# Patient Record
Sex: Male | Born: 1945
Health system: Southern US, Community
[De-identification: ages and names within clinical notes are randomized; demographics above are authoritative.]

## PROBLEM LIST (undated history)

## (undated) DIAGNOSIS — E782 Mixed hyperlipidemia: Secondary | ICD-10-CM

## (undated) DIAGNOSIS — Z87442 Personal history of urinary calculi: Secondary | ICD-10-CM

## (undated) DIAGNOSIS — I2510 Atherosclerotic heart disease of native coronary artery without angina pectoris: Secondary | ICD-10-CM

## (undated) DIAGNOSIS — N471 Phimosis: Secondary | ICD-10-CM

## (undated) DIAGNOSIS — K5790 Diverticulosis of intestine, part unspecified, without perforation or abscess without bleeding: Secondary | ICD-10-CM

## (undated) DIAGNOSIS — I700 Atherosclerosis of aorta: Secondary | ICD-10-CM

## (undated) DIAGNOSIS — I701 Atherosclerosis of renal artery: Secondary | ICD-10-CM

## (undated) DIAGNOSIS — I10 Essential (primary) hypertension: Secondary | ICD-10-CM

## (undated) DIAGNOSIS — N289 Disorder of kidney and ureter, unspecified: Secondary | ICD-10-CM

## (undated) DIAGNOSIS — Z8719 Personal history of other diseases of the digestive system: Secondary | ICD-10-CM

## (undated) DIAGNOSIS — N433 Hydrocele, unspecified: Secondary | ICD-10-CM

## (undated) DIAGNOSIS — C679 Malignant neoplasm of bladder, unspecified: Secondary | ICD-10-CM

## (undated) HISTORY — PX: TRANSURETHRAL RESECTION OF BLADDER TUMOR: SHX2575

## (undated) HISTORY — PX: OTHER SURGICAL HISTORY: SHX169

## (undated) HISTORY — PX: TONSILLECTOMY: SUR1361

## (undated) HISTORY — PX: APPENDECTOMY: SHX54

---

## 1999-10-22 ENCOUNTER — Ambulatory Visit (HOSPITAL_COMMUNITY): Admission: RE | Admit: 1999-10-22 | Discharge: 1999-10-22 | Payer: Self-pay | Admitting: Family Medicine

## 1999-10-22 ENCOUNTER — Encounter: Payer: Self-pay | Admitting: Family Medicine

## 2001-07-16 ENCOUNTER — Encounter: Payer: Self-pay | Admitting: Emergency Medicine

## 2001-07-17 ENCOUNTER — Inpatient Hospital Stay (HOSPITAL_COMMUNITY): Admission: EM | Admit: 2001-07-17 | Discharge: 2001-07-21 | Payer: Self-pay | Admitting: Emergency Medicine

## 2002-05-18 ENCOUNTER — Ambulatory Visit (HOSPITAL_COMMUNITY): Admission: RE | Admit: 2002-05-18 | Discharge: 2002-05-19 | Payer: Self-pay | Admitting: Cardiology

## 2003-11-15 ENCOUNTER — Observation Stay (HOSPITAL_COMMUNITY): Admission: EM | Admit: 2003-11-15 | Discharge: 2003-11-16 | Payer: Self-pay | Admitting: Emergency Medicine

## 2008-04-26 ENCOUNTER — Inpatient Hospital Stay (HOSPITAL_COMMUNITY): Admission: RE | Admit: 2008-04-26 | Discharge: 2008-04-27 | Payer: Self-pay | Admitting: Cardiology

## 2008-05-12 ENCOUNTER — Encounter (HOSPITAL_COMMUNITY): Admission: RE | Admit: 2008-05-12 | Discharge: 2008-08-10 | Payer: Self-pay | Admitting: Cardiology

## 2009-02-11 DIAGNOSIS — C679 Malignant neoplasm of bladder, unspecified: Secondary | ICD-10-CM

## 2009-02-11 HISTORY — DX: Malignant neoplasm of bladder, unspecified: C67.9

## 2009-06-05 ENCOUNTER — Ambulatory Visit (HOSPITAL_BASED_OUTPATIENT_CLINIC_OR_DEPARTMENT_OTHER): Admission: RE | Admit: 2009-06-05 | Discharge: 2009-06-06 | Payer: Self-pay | Admitting: Urology

## 2010-05-01 LAB — POCT I-STAT 4, (NA,K, GLUC, HGB,HCT)
Glucose, Bld: 119 mg/dL — ABNORMAL HIGH (ref 70–99)
HCT: 40 % (ref 39.0–52.0)
Hemoglobin: 13.6 g/dL (ref 13.0–17.0)
Potassium: 4.5 mEq/L (ref 3.5–5.1)
Sodium: 140 mEq/L (ref 135–145)

## 2010-05-24 LAB — CBC
HCT: 38.4 % — ABNORMAL LOW (ref 39.0–52.0)
Hemoglobin: 13.7 g/dL (ref 13.0–17.0)
MCHC: 35.6 g/dL (ref 30.0–36.0)
MCV: 89.7 fL (ref 78.0–100.0)
Platelets: 128 10*3/uL — ABNORMAL LOW (ref 150–400)
RBC: 4.28 MIL/uL (ref 4.22–5.81)
RDW: 12.6 % (ref 11.5–15.5)
WBC: 5.8 10*3/uL (ref 4.0–10.5)

## 2010-05-24 LAB — BASIC METABOLIC PANEL
BUN: 13 mg/dL (ref 6–23)
Calcium: 8.6 mg/dL (ref 8.4–10.5)
Creatinine, Ser: 1.02 mg/dL (ref 0.4–1.5)
GFR calc Af Amer: >60 mL/min (ref >60)
GFR calc non Af Amer: >60 mL/min (ref >60)

## 2010-06-26 NOTE — Discharge Summary (Signed)
Chad [name], Chad [name] NO.:  [id number]   MEDICAL RECORD NO.:  [id number]          PATIENT TYPE:  INP   LOCATION:  2508                         FACILITY:  [facility]   PHYSICIAN:  Francisca December, M.D.  DATE OF BIRTH:  10-Jun-1945   DATE OF ADMISSION:  04/26/2008  DATE OF DISCHARGE:  04/27/2008                               DISCHARGE SUMMARY   DISCHARGE DIAGNOSES:  1. Coronary artery disease status post Endeavor stent to the right      coronary artery.  2. Known coronary artery disease.  3. Hyperlipidemia.  4. Gastroesophageal reflux disease.  5. Strong family history of coronary artery disease.  6. Status post appendectomy.  7. Long-term medication use.   HOSPITAL COURSE:  [name] is a 65 year old male who had a Cardiolite,  that I assume from our records was a routine Cardiolite, that revealed a  change from one done prior to this one.  Because of the results, Dr.  Amil Amen wanted to follow up with cardiac catheterization and the patient  agreed.   The patient was brought into the hospital on April 26, 2008, and  underwent Endeavor stent x2 to the right coronary artery.  He tolerated  this procedure well.  He was kept in the hospital overnight and was  ready for discharge home the following day.   LABORATORY STUDIES:  Hemoglobin 13.7, hematocrit 38.4, platelets 120,  white count 5.8.  Sodium 141, potassium 4.1, BUN 13, creatinine 1.02.   The patient is being discharged to home in stable but improved  condition.   DISCHARGE MEDICATIONS:  1. Plavix 75 mg a day.  2. Protonix 40 mg a day.  3. Vytorin 10/80 mg daily.  4. Folic acid 1600 mcg daily.  5. Niaspan 1000 mg a day.  6. Enteric-coated aspirin 325 mg a day.  7. Metoprolol 50 mg 1 tablet twice a day (the admission note states      that he was on 25 mg one-half tablet twice a day, but the patient      reassures me that he was on 50 mg tablet and was taking half of      this twice a day).   The patient  is to increase activity slowly.  No lifting over 10 pounds  for 1 week.  No driving for 2 days.  Remain on a low-sodium heart-  healthy diet.  Clean cath site gently with soap and water.  Follow up  with Dr. Deitra [name], nurse practitioner, on May 10, 2008, at  1:00 p.m.      Chad [name], P.A.      Francisca December, M.D.  Electronically Signed    LB/MEDQ  D:  04/27/2008  T:  04/27/2008  Job:  [id number]   cc:   Chad [name], M.D.

## 2010-06-29 NOTE — H&P (Signed)
NAME:  Chad [name], Chad [name] NO.:  [id number]   MEDICAL RECORD NO.:  [id number]          PATIENT TYPE:  INP   LOCATION:  2901                         FACILITY:  [facility]   PHYSICIAN:  Francisca December, M.D.  DATE OF BIRTH:  Jun 18, 1945   DATE OF ADMISSION:  11/15/2003  DATE OF DISCHARGE:                                HISTORY & PHYSICAL   PRIMARY CARE PHYSICIAN:  Francesca Oman, M.D.   CARDIOLOGIST:  Corliss Marcus, M.D.   CHIEF COMPLAINT:  Chest pain.   HISTORY OF PRESENT ILLNESS:  This is a 65 year old male with known history  of CAD status post stent to the proximal circumflex and a PLA branch in June  2003, with subsequent Taxus stents to the LAD and an ostial diagonal off the  LAD in April 2004.  The patient had normal Cardiolite study at our office in  January 2004 and had not been experiencing any problems such as chest pain,  etc., until the evening prior to admission about 8:30 p.m. to 9 p.m.  He  began with an onset of vague left anterior chest pain which is unlike his  prior cardiac pain that radiated to his left shoulder and down his left arm.  He described the chest discomfort as being level 2/10.  He was most bothered  by the left arm pain and numbness and tingling sensation and was at least a  4/10.  The discomfort would wax and wane during the night and essentially  did not resolve until he came to the ER this morning about 11 a.m. and  received sublingual nitroglycerin.  The patient did not take any  nitroglycerin at home.  Currently, he is free of any discomfort in the ER.  He denied any associated symptoms with this pain and has not experienced any  angina or dyspnea on exertion or orthopnea since last being seen at our  office until the past 24 hours.   REVIEW OF SYSTEMS:  No fevers, chills, myalgias, etc.  Denies dizziness,  unilateral visual disturbance, any difficulty with gripping on the right or  left hand.  No facial drooping or speech changes.   Cardiac and respiratory  symptoms as above.  No cough or hemoptysis.  No abdominal pain.  No urinary  symptoms.  No lower extremity swelling or pain.   FAMILY MEDICAL HISTORY:  His father died at age 9, history of CABG.  Mother  died at age 56 of an MI.   SOCIAL HISTORY:  He is married, does not utilize tobacco or alcohol.  He  works as an Art gallery manager.   PAST MEDICAL HISTORY:  1.  CAD.      1.  PTCA with stent to the proximal circumflex and PLA, June 2003.      2.  Taxus stent to the LAD and ostial diagonal with stent crush to the          diagonal stent after the LAD stent was place.  2.  Dyslipidemia.  3.  NYHA class I angina in the past.  4.  Repeat Cardiolite at our office January 2004  normal.  Recommendation at      that time was to continue Plavix for an additional 9 months.  5.  GERD.   ALLERGIES:  PENICILLIN, which causes a rash.   MEDICATIONS:  1.  Plavix 75 mg daily.  2.  Folic acid 1.5 mg daily.  3.  Zocor 40 mg daily.  4.  Aspirin 325 mg daily.   PHYSICAL EXAMINATION:  GENERAL:  Pleasant male, no acute distress.  Currently without any chest or arm pain.  VITAL SIGNS:  Initial blood pressure was 159/101; down to 128/79 with a  pulse of 57, respirations 20 after sublingual nitroglycerin.  NEUROLOGIC:  Alert and oriented x3.  Moving all extremities x4.  No focal  neurologic deficits.  HEENT:  Head is normocephalic.  Sclerae noninjected.  NECK:  Supple, no adenopathy.  CHEST:  Bilateral lung sounds are clear to auscultation posteriorly.  Respiratory effort is nonlabored.  He is on room air, saturating 99%.  HEART:  S1, S2.  No rubs, murmurs, thrills; no gallops.  No JVD.  Carotids  are 2+ bilaterally without bruits.  He is in sinus rhythm on bedside  telemetry.  ABDOMEN:  Soft.  Bowel sounds are present all four quadrants.  Abdomen is  nontender with minimal palpation.  There is no hepatosplenomegaly, masses,  or bruits noted.  No hernias as well.  EXTREMITIES:   Symmetric in appearance without effusion or erythema to the  large and small joints.  There is no dependent edema and pulses are 2+  bilaterally.   LABORATORY DATA:  Myoglobin 108 and 87.7, MB 1.1 and 1.5, troponin I is less  than 0.05 and less than 0.05.  Hemoglobin 15.4; hematocrit 43.6; white count  5700; platelet count 179,000.  Sodium 140, potassium 4.1, BUN 12, creatinine  1.3.  LFTs are normal.  Magnesium 2.3.  INR 0.9, PTT 27.  Diagnostics:  EKG  was done in the ER, shows sinus rhythm without any ST or T wave changes that  would be consistent with ischemia.  No old EKGs available for comparison.  Portable chest x-ray:  Read by the radiology as bibasilar atelectasis;  otherwise, no acute problems.   IMPRESSION:  1.  Chest pain, known coronary artery disease, two-vessel.  2.  Dyslipidemia.  3.  Gastroesophageal reflux disease.   PLAN:  Dr. Amil Amen has seen and evaluated the patient and will proceed as  follows:  1.  Continue aspirin and Plavix.  Add weight-based heparin.  2.  The patient was not on a beta blocker prior to admission . We will add      this as blood pressure and heart rate tolerate.  3.  Although the patient's symptoms are not typical of his prior cardiac      presentation, and EKG and enzymes are negative so far and the patient      has been experiencing pain for greater than 8 hours, Dr. Amil Amen is very      suspicious this is coronary ischemic pain since it was relieved by      nitroglycerin and initiation of heparin anticoagulation.  He has      recommended diagnostic coronary angiography, possible PTCA.  Goals,      risks, alternatives discussed.  The patient agrees and wishes to proceed      and the patient will go to the catheterization laboratory this afternoon      per Dr. Amil Amen.  4.  Again, we will repeat serial enzymes looking for any bump  in troponin I     or CK or MB that may indicate the patient has had an acute MI.  Will      repeat EKG in the  morning post catheterization.  5.  In regards to dyslipidemia, will continue his Zocor 40 and repeat a      fasting lipid panel since this apparently has not been done in several      months.  6.  In regards to the patient's history of GERD, if he develops symptoms      will consider adding a PPI.       ALE/MEDQ  D:  11/16/2003  T:  11/16/2003  Job:  [id number]   cc:   Meredith Staggers, M.D.  [address]  [place]  [place] [address]  Fax: [phone]

## 2010-06-29 NOTE — Procedures (Signed)
[facility]  Patient:    Chad [name], Chad [name] Visit Number: [id number] MRN: [id number]          Service Type: MED Location: [id number] Attending Physician:  Armanda Magic Dictated by:   Francisca December, M.D. Proc. Date: 07/20/01 Admit Date:  07/17/2001 Discharge Date: 07/21/2001   CC:         Meredith Staggers, M.D.   Procedure Report  PROCEDURES PERFORMED: 1. Percutaneous transluminal coronary angioplasty/stent implantation, proximal    left circumflex. 2. Percutaneous transluminal coronary angioplasty, distal posterolateral    branch via the right coronary artery.  INDICATIONS:  The patient is a 65 year old man admitted with unstable angina on July 17, 2001.  He underwent coronary angiography by Dr. Armanda Magic on July 17, 2001, revealing three-vessel disease with only moderate stenosis in the LAD.  There was subtotal stenosis in the distal right and left circumflex. Cardiac surgery was consulted and after consultation with them and the patient, the decision was made to proceed with percutaneous revascularization.  PROCEDURAL NOTE:  PCI was performed following the percutaneous insertion of a #6 French catheter sheath utilizing an anterior approach over a guiding J wire into the right femoral artery.  The patient received 73.5 mg of Angiomax in a constant infusion at 1.75 mg/kg per hour throughout the procedure.  A #6 [place] Voda 3.5 left guiding catheter was advanced to the ascending aorta where the left coronary os was engaged.  A 0.014-inch Scimed luge intracoronary guidewire was passed across the lesion in the left circumflex without difficulty.  Initial balloon dilatation was performed with a 3.0 x 15-mm Scimed Maverick intracoronary balloon.  This was inflated to 6 atmospheres for approximately 1 minute.  This balloon was removed and a 3.5 x 16-mm Scimed Express 2 intracoronary stent was positioned across the lesion carefully.  It was  deployed at a peak pressure of 16 atmospheres for approximately 45 seconds.  Following the intracoronary administration of 0.2 mg of nitroglycerin, cineangiography was performed in LAO and RAO projections, both with and without the guidewire in place.  This confirmed wide patency and the guiding catheter and guidewire were removed.  They were exchanged for a #6 [place] Keiz right horizontal takeoff #3 guiding catheter.  The right coronary os was engaged and a 0.014-inch Scimed luge intracoronary guidewire was passed across the lesion in the distal right coronary with minimal difficulty. Balloon dilatation was then performed with a 2.5 x 15-mm Scimed Maverick intracoronary balloon.  The balloon was inflated to a peak pressure of 8 atmospheres for approximately 2 minutes.  The balloon was removed and again, angiography performed in orthogonal views, confirming adequate patency.  The guidewire was removed as well as the guiding catheter.  The catheter sheath was sutured into place and the patient was transferred to the recovery area in stable condition with an intact distal pulse.  ACT on Angiomax was well over 300 seconds.  ANGIOGRAPHY:  As mentioned, the lesion treated was in the proximal left circumflex giving rise to a large circumflex marginal which was the dominant vessel on the lateral wall of the heart.  Following balloon dilatation and stent implantation, there was no residual stenosis.  The other treated lesion was in the distal posterolateral segment/branch off the right coronary.  Following balloon dilatation alone, there was a 20% residual stenosis.  FINAL IMPRESSION: 1. Atherosclerotic coronary vascular disease, three-vessel. 2. Status post successful percutaneous transluminal coronary angioplasty,    distal right coronary/posterolateral branch.  3. Status post successful percutaneous transluminal coronary angioplasty    stent, proximal left circumflex. 4. Typical angina was  not reproduced with device insertion or balloon    inflation. Dictated by:   Francisca December, M.D. Attending Physician:  Armanda Magic DD:  07/20/01 TD:  07/21/01 Job: [id number]

## 2010-06-29 NOTE — Cardiovascular Report (Signed)
NAME:  Chad, [name] NO.:  [id number]   MEDICAL RECORD NO.:  [id number]          PATIENT TYPE:  INP   LOCATION:  1827                         FACILITY:  [facility]   PHYSICIAN:  Francisca December, M.D.  DATE OF BIRTH:  January 01, 1946   DATE OF PROCEDURE:  DATE OF DISCHARGE:                              CARDIAC CATHETERIZATION   PROCEDURE PERFORMED:  1.  Left heart catheterization.  2.  Coronary angiography.  3.  Left ventriculogram.  4.  Percutaneous intervention/balloon dilatation on the ostial diagonal      branch.  5.  Intravascular ultrasound.  6.  Percutaneous closure of right femoral artery.   INDICATIONS:  [name] is a 65 year old man who is approximately 18  months status post drug-eluting stent implantation, crush technique, of  the small LAD and ostial diagonal.  He also has a stent in the left  circumflex dating to 2003.  A balloon dilatation was performed in the  posterolateral segment to the left ventricular branch at that time as well.  He presented to the emergency room today with prolonged anterior chest pain  and left arm discomfort.  No significant ECG changes were noted, but the  discomfort was promptly relieved with intravenous nitroglycerin.  He was  therefore brought to the catheterization laboratory at this time to identify  the extent of disease and provide for further therapeutic options.   PROCEDURAL NOTE:  Patient is brought to the cardiac catheterization  laboratory from the emergency room, where the right groin was prepped and  draped in the usual sterile fashion.  Local anesthesia was obtained with  infiltration of 1% lidocaine.  A 6 French catheter sheath was inserted  percutaneously into the right femoral artery utilizing an anterior approach  over a guiding J wire.  A 110 cm pigtail catheter was used and we measured  pressures in the ascending aorta and in the left ventricle both prior to and  following the ventriculogram.  A  30 degree RAO __________  left  ventriculogram was performed utilizing a power injector.  Then 45 cc of  contrast material was injected at 13 cc/second.  Scintiangiography of both  left and right coronary arteries was then performed using a 6 [place] #4 left  and right Judkins catheters.  Scintiangiography of each coronary artery was  conducted in multiple LAO and RAO projections.  The angiograms were then  studied carefully and compared to the previous 2004 angiogram.  The culprit  lesion was felt to be an ostial stenosis from the diagonal branch at the  site of the previous stent, crush technique.  There is a long 50-60%  stenosis in the proximal and mid right coronary, but it has not changed  significantly since 2004; therefore, I proceed with percutaneous  intervention on the ostial diagonal.   The 6 French catheter sheath was exchanged for a 7 French catheter sheath  overlying a guiding J wire.  The patient received 4500 units of heparin  intravenously, resulting in an ACT of 308 seconds.  He also received a  double bolus and constant infusion of Integrilin.  Initial attempts to cross  the lesion with a 0.014 ACS Guidant Cross-It XT 100 wire were unsuccessful.  This was removed and exchanged for a Whisper wire.  This was also  unsuccessful.  This was removed and exchanged for a Choice PT moderate  support wire.  After extensive manipulation, this did successfully cross the  lesion into the diagonal branch.  Initial balloon dilatation was performed  with a 1.5/20 mm SciMed Maverick intracoronary balloon.  It is inflated to  10 atmospheres for approximately 80 seconds.  This balloon was deflated and  removed, and a 2.0/15 SciMed Maverick intracoronary balloon advanced into  place.  This was inflated to 6 atmospheres for 90 seconds.  This balloon was  removed, and a SciMed luge intracoronary guidewire advanced down the LAD.  Intravascular ultrasound was performed on one pass with an  Atlantis  catheter.  The images were reviewed, and while there was not protruding  stent struts, there was a lot of irregularity in the lumen around the area  of the diagonal origin.  Therefore, the IVUS catheter was removed, and a  3.0/20 mm SciMed Maverick advanced into the LAD and a 2.5/15 mm SciMed  Maverick advanced into the diagonal ostium.  Both balloons were then  inflated to a peak pressure on the LAD of 4 atmospheres and on the diagonal  of 8 atmospheres.  This is for approximately one minute.  The balloons were  removed.  Intravascular ultrasound performed again.  I was not satisfied  with the appearance of the lumen of the LAD; therefore, the 3.0/20 mm  Maverick was again advanced into the LAD, and a 2.5/15 mm Maverick into the  diagonal.  Again, the balloons were inflated.  This time, the LAD balloon  was inflated to 5-6 atmospheres and the Maverick again to 8 atmospheres.  The balloons were removed.  Scintiangiography was performed __________  views and found to demonstrate patency.  The guidewire was removed, and  again, scintiangiography performed.  There was a horizontal separation  visualized in the LAD beneath the origin of the diagonal branch.  It may  represent an elevated stent __________  or a sub stent intimal dissection.  I did not feel that additional balloon dilatation was likely to be helpful  for this and may further compromise the origin of the diagonal, which was  widely patent; therefore, I discontinued further interventions.  A right  femoral arteriogram was performed in a 45 degree angulation.  It confirmed  the arteriotomy site to be well above the bifurcation.  The Angioseal  percutaneous closure device was then deployed successfully.  The patient was  transported to the recovery area in stable condition with an intact distal  pulse.   HEMODYNAMICS:  Systemic arterial pressure was 147/79 with a mean of 106 mmHg.  There was no systolic gradient across  the aortic valve.  The left  ventricular end-diastolic pressure was 9 mmHg.   ANGIOGRAPHY:  The left ventriculogram demonstrated intact left ventricular  size and global systolic function without regional wall motion abnormality.  A visual estimate of the ejection fraction is 60%.  There are stents easily  visible in the left coronary artery, and there is extensive right coronary  calcification.  There is no significant mitral regurgitation.   There was a right dominant coronary system present.  The main left coronary  artery was short and perhaps 30% stenotic.  This was diffusely.  The left  anterior descending artery and its branches were  highly diseased.  There is  a stent in the proximal portion, which is widely patent.  There are filling  defects and an irregularity around the origin of the diagonal branch, which  is 95% stenotic.  In the mid portion of the LAD distal to the stent at the  origin of the first septal perforator, there is a 30% focal stenosis, which  is eccentric.  The ongoing LAD is without significant obstruction, reaches  and traverses the apex.  The diagonal branch itself is quite large and  without significant obstruction other than in the ostium.   The left circumflex coronary artery and its branches were highly diseased  but widely patent.  The stent in the proximal to mid portion is widely  patent, and the distal vessel bifurcates on the obtuse margin into three  different vessels, the middle of which is the largest.  Two very small  marginal branches arise proximally but of are no clinical significance.  The  right coronary artery and its branches were highly diseased.  The vessel is  irregular and 30-40% stenotic in the proximal segment.  It then enters a mid  portion, which is 60% stenotic and diffuse in nature.  This extends down  into the junction of the distal and mid segments.  The distal segment is  without significant obstruction, although there are  luminal irregularities.  There is a moderate-sized posterior descending artery which arises and has a  30% ostial stenosis.  The posterolateral branch is large but gives rise to  only two small left ventricular branches, and there is a 30% stenosis at the  site of previous balloon dilatation right at the bifurcation into the left  ventricular branches.   Following balloon dilatation in the LAD and diagonal, there is about a 20%  residual stenosis in the diagonal.  There is a filling defect which is  parallel to the axis of the LAD just beneath the origin of the diagonal  branch.  This was present at the outset as well as the completion is a bit  more apparent.  I suspect it represents stent metal accumulated around the  origin of the diagonal branch and is not truly a dissection.   FINAL IMPRESSION:  1.  Atherosclerotic coronary vascular disease, three vessel.  2.  Widely patent left circumflex coronary stent. 3.  Successful  percutaneous intervention/balloon dilatation (complex),      ostial diagonal branch.  4.  Intact left ventricular size and global systolic function.  5.  Typical angina was not reproduced with balloon dilatation.       JHE/MEDQ  D:  11/15/2003  T:  11/15/2003  Job:  [id number]   cc:   Meredith Staggers, M.D.  [address]  [place]  [place] [address]  Fax: [phone]

## 2010-07-04 ENCOUNTER — Inpatient Hospital Stay (HOSPITAL_COMMUNITY)
Admission: EM | Admit: 2010-07-04 | Discharge: 2010-07-05 | DRG: 247 | Disposition: A | Discharge status: Home / Self Care | Payer: 59 | Source: Ambulatory Visit | Attending: Cardiology | Admitting: Cardiology

## 2010-07-04 DIAGNOSIS — K219 Gastro-esophageal reflux disease without esophagitis: Secondary | ICD-10-CM | POA: Diagnosis present

## 2010-07-04 DIAGNOSIS — I10 Essential (primary) hypertension: Secondary | ICD-10-CM | POA: Diagnosis present

## 2010-07-04 DIAGNOSIS — E7800 Pure hypercholesterolemia, unspecified: Secondary | ICD-10-CM | POA: Diagnosis present

## 2010-07-04 DIAGNOSIS — Z7902 Long term (current) use of antithrombotics/antiplatelets: Secondary | ICD-10-CM

## 2010-07-04 DIAGNOSIS — Z7982 Long term (current) use of aspirin: Secondary | ICD-10-CM

## 2010-07-04 DIAGNOSIS — I200 Unstable angina: Secondary | ICD-10-CM | POA: Diagnosis present

## 2010-07-04 DIAGNOSIS — I2510 Atherosclerotic heart disease of native coronary artery without angina pectoris: Principal | ICD-10-CM | POA: Diagnosis present

## 2010-07-04 LAB — POCT CARDIAC MARKERS
CKMB, poc: 3 ng/mL (ref 1.0–8.0)
Myoglobin, poc: 213 ng/mL (ref 12–200)

## 2010-07-04 LAB — DIFFERENTIAL
Basophils Absolute: 0.1 10*3/uL (ref 0.0–0.1)
Basophils Relative: 1 % (ref 0–1)
Lymphocytes Relative: 44 % (ref 12–46)
Neutro Abs: 2 10*3/uL (ref 1.7–7.7)

## 2010-07-04 LAB — CBC
HCT: 41.4 % (ref 39.0–52.0)
Hemoglobin: 14.7 g/dL (ref 13.0–17.0)
MCHC: 35.5 g/dL (ref 30.0–36.0)
RDW: 12.7 % (ref 11.5–15.5)
WBC: 5.6 10*3/uL (ref 4.0–10.5)

## 2010-07-04 LAB — CK TOTAL AND CKMB (NOT AT ARMC)
CK, MB: 4.8 ng/mL — ABNORMAL HIGH (ref 0.3–4.0)
Relative Index: 1.8 (ref 0.0–2.5)

## 2010-07-04 LAB — POCT I-STAT, CHEM 8
HCT: 42 % (ref 39.0–52.0)
Hemoglobin: 14.3 g/dL (ref 13.0–17.0)
Potassium: 4.2 mEq/L (ref 3.5–5.1)
Sodium: 139 mEq/L (ref 135–145)
TCO2: 25 mmol/L (ref 0–100)

## 2010-07-04 LAB — PROTIME-INR
INR: 0.98 (ref 0.00–1.49)
Prothrombin Time: 13.2 seconds (ref 11.6–15.2)

## 2010-07-04 LAB — CARDIAC PANEL(CRET KIN+CKTOT+MB+TROPI)
Total CK: 200 U/L (ref 7–232)
Troponin I: <0.3 ng/mL (ref <0.30)

## 2010-07-04 LAB — PLATELET COUNT: Platelets: 136 10*3/uL — ABNORMAL LOW (ref 150–400)

## 2010-07-04 LAB — MRSA PCR SCREENING: MRSA by PCR: POSITIVE — AB

## 2010-07-05 LAB — CBC
Hemoglobin: 13.5 g/dL (ref 13.0–17.0)
Platelets: 142 10*3/uL — ABNORMAL LOW (ref 150–400)
RBC: 4.4 MIL/uL (ref 4.22–5.81)
WBC: 5.8 10*3/uL (ref 4.0–10.5)

## 2010-07-05 LAB — BASIC METABOLIC PANEL
BUN: 14 mg/dL (ref 6–23)
Chloride: 103 mEq/L (ref 96–112)
GFR calc Af Amer: >60 mL/min (ref >60)
GFR calc non Af Amer: >60 mL/min (ref >60)
Potassium: 3.9 mEq/L (ref 3.5–5.1)
Sodium: 138 mEq/L (ref 135–145)

## 2010-07-05 LAB — CARDIAC PANEL(CRET KIN+CKTOT+MB+TROPI)
CK, MB: 3.3 ng/mL (ref 0.3–4.0)
Relative Index: 1.9 (ref 0.0–2.5)
Total CK: 177 U/L (ref 7–232)

## 2010-07-05 LAB — HEPARIN LEVEL (UNFRACTIONATED): Heparin Unfractionated: <0.1 IU/mL — ABNORMAL LOW (ref 0.30–0.70)

## 2010-07-11 ENCOUNTER — Ambulatory Visit (HOSPITAL_COMMUNITY)
Admission: RE | Admit: 2010-07-11 | Discharge: 2010-07-12 | Disposition: A | Discharge status: Home / Self Care | Payer: 59 | Source: Ambulatory Visit | Attending: Interventional Cardiology | Admitting: Interventional Cardiology

## 2010-07-11 DIAGNOSIS — Z8614 Personal history of Methicillin resistant Staphylococcus aureus infection: Secondary | ICD-10-CM | POA: Insufficient documentation

## 2010-07-11 DIAGNOSIS — E785 Hyperlipidemia, unspecified: Secondary | ICD-10-CM | POA: Insufficient documentation

## 2010-07-11 DIAGNOSIS — B029 Zoster without complications: Secondary | ICD-10-CM | POA: Insufficient documentation

## 2010-07-11 DIAGNOSIS — I2510 Atherosclerotic heart disease of native coronary artery without angina pectoris: Secondary | ICD-10-CM | POA: Insufficient documentation

## 2010-07-11 LAB — POCT ACTIVATED CLOTTING TIME: Activated Clotting Time: 399 seconds

## 2010-07-12 LAB — CBC
HCT: 36.6 % — ABNORMAL LOW (ref 39.0–52.0)
MCHC: 35 g/dL (ref 30.0–36.0)
Platelets: 124 10*3/uL — ABNORMAL LOW (ref 150–400)
RDW: 12.6 % (ref 11.5–15.5)
WBC: 4.6 10*3/uL (ref 4.0–10.5)

## 2010-07-12 LAB — PLATELET INHIBITION P2Y12
P2Y12 % Inhibition: 7 %
Platelet Function Baseline: 294 [PRU] (ref 194–418)

## 2010-07-12 LAB — BASIC METABOLIC PANEL
Calcium: 8.1 mg/dL — ABNORMAL LOW (ref 8.4–10.5)
GFR calc non Af Amer: >60 mL/min (ref >60)
Glucose, Bld: 99 mg/dL (ref 70–99)
Sodium: 139 mEq/L (ref 135–145)

## 2010-07-13 LAB — PLATELET INHIBITION P2Y12
P2Y12 % Inhibition: 0 %
Platelet Function  P2Y12: 309 [PRU] (ref 194–418)
Platelet Function Baseline: 314 [PRU] (ref 194–418)

## 2010-07-19 NOTE — Cardiovascular Report (Signed)
NAME:  Chad [name], Chad [name] NO.:  [id number]  MEDICAL RECORD NO.:  [id number]           PATIENT TYPE:  O  LOCATION:  2918                         FACILITY:  [facility]  PHYSICIAN:  [name], MDDATE OF BIRTH:  09/04/1945  DATE OF PROCEDURE:  07/11/2010 DATE OF DISCHARGE:                           CARDIAC CATHETERIZATION   PRIMARY CARE PHYSICIAN:  Tally Joe, MD  PROCEDURES PERFORMED:  Left heart catheterization and percutaneous coronary intervention of the right coronary artery.  OPERATOR:  [name], MD.  INDICATION:  Coronary artery disease.  PROCEDURE NARRATIVE:  The patient was here in the Cath Lab a week ago after coming in with unstable angina.  He had PCI attempted from the right radial.  The right coronary artery was completely occluded.  We were able to open the complete occlusion proximally and able to balloon the midportion of the right coronary artery, but were unable to the liver stent to the mid right due to vessel tortuosity proximally and poor guide support coming from the radial approach.  Therefore, he is brought back and we decided to come from the right femoral approach to get better support and hopefully better delivering devices down the right coronary artery.  He was prepped and draped in the usual sterile fashion.  His right groin was infiltrated with 1% lidocaine.  A 6-French sheath was placed into the right common femoral artery using modified Seldinger technique.  A hockey-stick guiding catheter was used initially across the aortic valve.  Left ventricular pressure was obtained.  The catheter was pulled back under continuous hemodynamic pressure monitoring.  The guide was then used to engage the ostium of the right coronary artery.  A Prowater wire was placed into the distal right coronary artery past the areas of disease.  There was in-stent restenosis in the mid right coronary artery which as above had not  been successfully stented.  There was a 70% ostial right coronary artery lesion which did hamper of the guide sitting very well.  A 3.0 x 20 [place] Trek balloon was advanced to the distal RCA stent and inflated to 14 atmospheres and then to 16 atmospheres distally.  We attempted to get a Xience Prime 38 mm stent to the mid right coronary artery, but we were unable to.  We then advanced a Mailman wire into the distal right as a buddy wire, subsequently Xience stent did travel to the mid right.  A 2.75 x 38 Xience Prime stent was deployed at 20 atmospheres for 30 seconds.  The entire stented area was postdilated with a 3.5 x 25 [place] Trek balloon and inflated anywhere between 18 atmospheres and 22 atmospheres.  Attention was then turned to the ostium of the right coronary artery.  A 3.5 x 8 mm Promus stent was inflated at 16 atmospheres at the ostium overlapping the prior proximal stent.  The same stent balloon was used to post-dilate the overlap and flare of the ostium of the stent to 20 and 22 atmospheres which obtained a diameter of 3.97 mm.  TIMI 3 flow was maintained throughout the case.  HEMODYNAMICS:  Left ventricular  pressure 145/3 with LVEDP of 11 mmHg. Aortic pressure 147/74 with a mean aortic pressure of 103 mmHg.  IMPRESSION:  Successful percutaneous coronary intervention of the right coronary artery, percutaneous transluminal coronary angioplasty to the distal stent.  This successfully post-dilate the stent which could not be completed on previous procedure.  Successful drug-eluting stent to mid right coronary artery and to the ostial right coronary artery.  The ostial right coronary artery was postdilated 2 x 4 mm revealing normal left ventricular pressure.  RECOMMENDATIONS:  Continue aspirin and Plavix.  We will watch him overnight.  He will need to stay on aspirin and Plavix indefinitely.     [name], MD     JSV/MEDQ  D:  07/11/2010  T:  07/11/2010  Job:   [id number]  Electronically Signed by Lance Muss MD on 07/19/2010 12:18:12 PM

## 2010-07-19 NOTE — H&P (Signed)
NAME:  Chad [name], Chad [name] NO.:  [id number]  MEDICAL RECORD NO.:  [id number]           PATIENT TYPE:  I  LOCATION:  2916                         FACILITY:  [facility]  PHYSICIAN:  [name], MDDATE OF BIRTH:  [birth year]  DATE OF ADMISSION:  07/04/2010 DATE OF DISCHARGE:                             HISTORY & PHYSICAL   PRIMARY CARDIOLOGIST:  Armanda Magic, MD  PRIMARY CARE PHYSICIAN:  Tally Joe, MD  REASON FOR ADMISSION:  Chest discomfort.  HISTORY OF PRESENT ILLNESS:  A 65 year old who has known coronary artery disease.  He has had stents to all 3 major coronary arteries.  He woke up with 4/10 chest pain this morning about 5:30.  He did not take any nitroglycerin at home.  He called EMS and received 2 nitroglycerin on the way.  His pain came down from a 4 to about 1/10.  He does feel better now, but the pain has not gone.  It is somewhat different than his prior angina.  There is no current nausea or vomiting.  There is no sweating and pain does not radiate anywhere.  He has been hemodynamically stable.  MEDICATIONS: 1. Plavix 75 mg daily. 2. Protonix 40 mg daily. 3. Vytorin 10/40 mg daily. 4. Niaspan 1 g daily. 5. Finasteride. 6. Metoprolol 25 mg daily. 7. Aspirin 325 mg daily.  ALLERGIES:  PENICILLIN.  SOCIAL HISTORY:  He is married.  He does not smoke.  PAST MEDICAL HISTORY: 1. Coronary artery disease. 2. High cholesterol. 3. GERD. 4. Hypertension.  FAMILY HISTORY:  Significant for coronary artery disease.  PAST SURGICAL HISTORY:  Bladder surgery, appendectomy, tonsillectomy.  REVIEW OF SYSTEMS:  Significant for the chest discomfort.  He had been feeling well up until then.  No bleeding problems.  No focal weakness. No rash.  All other systems negative.  PHYSICAL EXAMINATION:  VITAL SIGNS:  Blood pressure 119/65, pulse 57. GENERAL:  He is awake and alert, no apparent distress. HEAD:  Normocephalic, atraumatic. EYES:  Extraocular  movements intact. NECK:  No JVD. CARDIOVASCULAR:  Regular rate and rhythm.  S1 and S2. LUNGS:  Clear to auscultation bilaterally. ABDOMEN:  Obese. EXTREMITIES:  No edema, palpable pedal pulses, 3+ radial pulse. NEURO:  No focal motor or sensory deficits. SKIN:  No rash. BACK:  No kyphosis. PSYCH:  Mildly flat affect.  LABORATORY DATA:  Creatinine 1.4, troponin negative.  Hemoglobin 14.7, platelets 147.  Prior cath showed complex LAD bifurcation stent performed in 2005.  He had a circumflex stent placed in 2003, Endeavor drug-eluting stent placed in the right coronary artery in 2010.  ASSESSMENT/PLAN: 1. Unstable angina.  We will plan for cath today.  Placed the patient     on IV heparin and IV nitroglycerin.  We will admit him to the step-     down since he is still having some discomfort.  Cycle cardiac     enzymes to evaluate for any myocardial injury. 2. Medical therapy for lipids will be continued.  Continue beta-     blocker given his coronary artery disease. 3. The patient and his wife are agreeable to repeat catheterization.  We will plan for radial approach.     [name], MD     JSV/MEDQ  D:  07/04/2010  T:  07/04/2010  Job:  [id number]  Electronically Signed by Lance Muss MD on 07/19/2010 12:24:09 PM

## 2010-07-19 NOTE — Cardiovascular Report (Signed)
NAME:  OSHA, ERRICO NO.:  [id number]  MEDICAL RECORD NO.:  [id number]           PATIENT TYPE:  I  LOCATION:  2916                         FACILITY:  [facility]  PHYSICIAN:  [name], MDDATE OF BIRTH:  Nov 03, 1945  DATE OF PROCEDURE:  07/04/2010 DATE OF DISCHARGE:  07/05/2010                           CARDIAC CATHETERIZATION   PROCEDURE PERFORMED:  Left heart catheterization, coronary angiogram, percutaneous coronary intervention of the right coronary artery.  OPERATOR:  [name], MD  INDICATIONS:  Unstable angina.  PROCEDURE NARRATIVE:  The risks and benefits of cardiac catheterization were explained to the patient and informed consent was obtained.  He was brought to the cath lab.  He was prepped and draped in usual sterile fashion.  His right wrist was infiltrated with 1% lidocaine.  A 5-French glide sheath was placed into the right radial artery using modified Seldinger technique.  Left coronary artery angiography was performed using a JL-4 pigtail catheter.  The catheter was advanced to the vessel ostium under fluoroscopic guidance.  Digital angiography was performed in multiple projections using hand injection of contrast.  Right coronary artery angiography was performed after the sheath was exchanged for a 6-French sheath.  We used a JR-5 guiding catheter to obtain images of the right coronary artery.  The intervention was then performed. Please see below for details.  After the PCI, pigtail catheter was advanced into the ascending aorta and across the aortic valve under fluoroscopic guidance.  No ventriculogram was performed because of the previous contrast dye used.  The catheter was pulled back under continuous hemodynamic pressure monitoring.  The sheath was removed and a TR band was used for hemostasis.  Angiomax was used for anticoagulation.  Several doses of intracoronary nitroglycerin were administered, also nitroglycerin was  administered through the sheath to treat vasospasm.  The nitroglycerin also improved vasospasm in the coronary arteries as well.  FINDINGS:  The left main artery is widely patent. Left circumflex is a large vessel.  In the proximal circumflex, there is an old stent which appeared widely patent.  The OM-1 was large and had mild ostial disease. Left anterior descending was a large vessel which reached the apex.  In the proximal to midportion of the vessel, there was a bifurcation with first diagonal, that bifurcation appeared widely patent.  The second and third diagonals were very small and widely patent.  There were obvious left-to-right collaterals. The RCA was occluded proximally.  Once we established some flow, it appeared that this was a long occlusion involving the entire stented area which was about 40 mm.  After getting some flow down it was discovered that there are sequential 90% stenoses in the distal right coronary artery. Left ventricular pressure 113/7 with an LVEDP of 12 mmHg, aortic pressure 116/66 with a mean aortic pressure of 85 mmHg. PCI of the right coronary artery.  A JR-5 guiding catheter was used, a Prowater wire was used to cross the lesion and placed into the posterolateral artery.  A 2.0 x 12 apex balloon was inflated to 14 atmospheres, then at 16 atmospheres.  There was still minimal flow  restored with it.  A 2.5 x 20 apex balloon was then advanced into the entire stented area and inflated to 14 atmospheres and then to 16 atmospheres.  This did restore flow and showed that the entire stented area was significantly diseased.  There was also significant disease past the stented area as well as in the proximal vessel.  We were unable to advance a stent past the proximal restenotic area to the calcification.  A BMW wire was placed as a buddy wire.  A 3.0 x 12 apex was inflated to 16 atmospheres in the proximal area.  Despite this, we were still unable to advance  a stent across this area.  A 2.5 x 10 Flextone balloon was then deployed in the calcified area and inflated to 8 atmospheres.  A 2.5 x 16 PROMUS Element stent did travel into the diseased area and was used to stent the proximal vessel which appeared to be the most significantly diseased area of the occlusion. Overlapping stents in the proximal RCA were placed with a subsequent 3.0 x 12 PROMUS Element stent placed.  The entire stented area was postdilated with a 3.5 x 15 [place] apex balloon inflated 16 atmospheres. The RAO view then revealed how severely diseased the distal right coronary artery was, therefore, we tried to advance a 2.25 x 32 PROMUS stent to the distal RCA but were unsuccessful.  We then advanced a guide liner with the tip of this to the edge of the stented area in the proximal vessel and a 2.25 x 32 stent then advanced to the distal area. We deployed the stent to 14 atmospheres for 32 seconds.  We attempted to get a stent to the midportion of the vessel.  This area had only been ballooned, but even with a guide liner we were unable to get this stent to advance.  At this point, the patient was pain-free.  He had TIMI 3 flow through the vessel and did not have an obvious dissection that we were unable to stent.  We therefore elected to stop since he had received about 200 mL of contrast and had a creatinine starting at 1.4. This procedure was complicated because the backup from our guide catheter coming from the radial approach was poor and also appeared that there was significant tortuosity in the right subclavian area making it difficult to torque catheters.  Initially, we had tried a no torque diagnostic right to engage the RCA but this catheter would not torque at all.  We decided that we will likely bring the patient back and do a catheterization from the groin, we may use 7-French system and hopefully this will help the amount of support that we have in performing  the intervention.  This procedure took over 2-1/2 hours where normally a right coronary artery revascularization will take in the range to about an hour and as I said this was quite a complex procedure.  IMPRESSION: 1. Patent left anterior descending and left circumflex stents. 2. Occluded right coronary artery.  Proximal and distal right coronary     artery stented with drug-eluting stent, unable to subsequently get     a stent to the mid area.  PLAN:  Continue aspirin and Plavix indefinitely.  We will also have him on Integrilin for about 18 hours.  Watch him overnight and consider bringing the patient back in doing catheterization from the groin with the hopes of getting better back up and improving the likelihood that we will be able to  deliver a stent to the mid RCA.     [name], MD     JSV/MEDQ  D:  07/05/2010  T:  07/06/2010  Job:  [id number]  Electronically Signed by Lance Muss MD on 07/19/2010 12:18:01 PM

## 2010-07-19 NOTE — Discharge Summary (Signed)
  NAME:  Chad [name], Chad [name] NO.:  [id number]  MEDICAL RECORD NO.:  [id number]           PATIENT TYPE:  O  LOCATION:  2918                         FACILITY:  [facility]  PHYSICIAN:  [name], MDDATE OF BIRTH:  11-Jun-1945  DATE OF ADMISSION:  07/11/2010 DATE OF DISCHARGE:  07/12/2010                              DISCHARGE SUMMARY   FINAL DIAGNOSES: 1. Coronary artery disease. 2. Hyperlipidemia.  PROCEDURE PERFORMED:  Cardiac catheterization with 2 drug-eluting stents placed to the right coronary artery.  HOSPITAL COURSE:  The patient underwent his procedure and had 2 stents placed in the right coronary artery successfully.  There were no groin complications.  He had an Angio-Seal.  He did have shingles at the time of presentation in the hospital.  He was kept on her airborne precautions and because of his prior MRSA, he was also kept on contact precautions.  He had no chest pain.  There were no bleeding issues. Labs were stable.  We checked a platelet inhibition test and he was found to have 0% platelet inhibition.  This was rechecked and then it was 7%.  Therefore, he was switched to Brilinta.  His aspirin dose was dropped to 81 mg daily.  DISCHARGE MEDICATIONS: 1. Aspirin 81 mg daily. 2. Brilinta 90 mg b.i.d. 3. Protonix 40 mg daily. 4. CoQ10. 5. Vytorin 10/40. 6. Finasteride 5 mg daily. 7. Folic acid daily. 8. Metoprolol succinate 25 mg daily. 9. Niaspan 1 gram daily.  He is to stop taking Plavix 75 mg daily and to decrease the dose of aspirin to 81 mg.  FOLLOWUP APPOINTMENTS:  With Dr. Eldridge Dace on July 31, 2010.  DIET:  Low-sodium heart-healthy diet.  ACTIVITY:  Increase activity slowly.  No lifting more than 10 pounds for about a week.     [name], MD     JSV/MEDQ  D:  07/12/2010  T:  07/13/2010  Job:  [id number]  Electronically Signed by Lance Muss MD on 07/19/2010 12:19:15 PM

## 2010-08-01 NOTE — Discharge Summary (Signed)
  NAMEMarland [name]  GASPARD, ISBELL NO.:  [id number]  MEDICAL RECORD NO.:  [id number]  LOCATION:  2916                         FACILITY:  [facility]  PHYSICIAN:  [name], MDDATE OF BIRTH:  03/13/1945  DATE OF ADMISSION:  07/04/2010 DATE OF DISCHARGE:  07/05/2010                              DISCHARGE SUMMARY   FINAL DIAGNOSES: 1. Unstable angina. 2. Coronary artery disease. 3. Hyperlipidemia.  PROCEDURE PERFORMED:  Cardiac catheterization with 2 drug-eluting stents placed into the right coronary artery along with balloon angioplasty to the mid right coronary artery.  HOSPITAL COURSE:  The patient was seen in the emergency room because of unstable angina symptoms.  He underwent cardiac catheterization showing an occluded right coronary artery.  This was caused by in-stent restenosis of the drug-eluting stents he had placed back in 2010.  We attempted revascularization of the right coronary artery.  He did have successful balloon angioplasty throughout the long occluded segment, however, due to poor guide support and tortuosity in his right subclavian artery, we were unable to deliver stents to the distal right coronary artery and mid right coronary artery after balloon angioplasty. We stopped the procedure and decided that we would bring him back for a repeat catheterization from the groin approach.  This would hopefully give better support.  There was no visible dissection in the mid right coronary artery where the angioplasty had been performed.  He did not have any further chest pain after the procedure.  He tolerated the procedure well.  There were no bleeding issues.  His labs were stable.  DISCHARGE MEDICATIONS: 1. Chlorhexidine. 2. Bactroban. 3. Protonix 40 mg daily. 4. Aspirin 325 mg daily. 5. Plavix 75 mg daily. 6. CoQ10. 7. Vytorin 10/40 mg daily. 8. Finasteride 5 mg daily. 9. Folic acid. 10.Metoprolol XL 25 mg p.o. daily. 11.Niacin 1 g p.o.  daily. 12.Ring Relief over the counter.  INSTRUCTIONS:  He is to increase activity slowly.  Follow post radial cath instructions.  DIET:  Low-sodium, heart-healthy diet.  FOLLOWUP:  With Dr. Eldridge Dace on July 31, 2010, at 2:45.  He will also have a repeat catheterization.  We will have the office call him for this appointment.     [name], MD     JSV/MEDQ  D:  07/19/2010  T:  07/20/2010  Job:  [id number]  Electronically Signed by Lance Muss MD on 08/01/2010 12:11:10 PM

## 2012-10-31 ENCOUNTER — Other Ambulatory Visit: Payer: Self-pay | Admitting: Interventional Cardiology

## 2012-10-31 DIAGNOSIS — E7800 Pure hypercholesterolemia, unspecified: Secondary | ICD-10-CM

## 2012-10-31 DIAGNOSIS — Z79899 Other long term (current) drug therapy: Secondary | ICD-10-CM

## 2013-01-03 ENCOUNTER — Observation Stay (HOSPITAL_COMMUNITY)
Admission: EM | Admit: 2013-01-03 | Discharge: 2013-01-04 | Disposition: A | Discharge status: Home / Self Care | Payer: 59 | Attending: Internal Medicine | Admitting: Internal Medicine

## 2013-01-03 ENCOUNTER — Emergency Department (HOSPITAL_COMMUNITY): Payer: 59

## 2013-01-03 ENCOUNTER — Encounter (HOSPITAL_COMMUNITY): Payer: Self-pay | Admitting: Emergency Medicine

## 2013-01-03 DIAGNOSIS — Z9861 Coronary angioplasty status: Secondary | ICD-10-CM | POA: Insufficient documentation

## 2013-01-03 DIAGNOSIS — R079 Chest pain, unspecified: Principal | ICD-10-CM | POA: Insufficient documentation

## 2013-01-03 DIAGNOSIS — Z880 Allergy status to penicillin: Secondary | ICD-10-CM | POA: Insufficient documentation

## 2013-01-03 DIAGNOSIS — I2510 Atherosclerotic heart disease of native coronary artery without angina pectoris: Secondary | ICD-10-CM | POA: Insufficient documentation

## 2013-01-03 DIAGNOSIS — I10 Essential (primary) hypertension: Secondary | ICD-10-CM | POA: Insufficient documentation

## 2013-01-03 DIAGNOSIS — E782 Mixed hyperlipidemia: Secondary | ICD-10-CM

## 2013-01-03 HISTORY — DX: Atherosclerotic heart disease of native coronary artery without angina pectoris: I25.10

## 2013-01-03 HISTORY — DX: Essential (primary) hypertension: I10

## 2013-01-03 HISTORY — DX: Mixed hyperlipidemia: E78.2

## 2013-01-03 LAB — BASIC METABOLIC PANEL
Calcium: 9.1 mg/dL (ref 8.4–10.5)
GFR calc non Af Amer: 57 mL/min — ABNORMAL LOW (ref >90)
Glucose, Bld: 103 mg/dL — ABNORMAL HIGH (ref 70–99)
Sodium: 140 mEq/L (ref 135–145)

## 2013-01-03 LAB — CBC
Hemoglobin: 14.7 g/dL (ref 13.0–17.0)
MCH: 32.5 pg (ref 26.0–34.0)
MCHC: 36.1 g/dL — ABNORMAL HIGH (ref 30.0–36.0)
Platelets: 177 10*3/uL (ref 150–400)

## 2013-01-03 LAB — POCT I-STAT TROPONIN I

## 2013-01-03 MED ORDER — METOPROLOL SUCCINATE ER 25 MG PO TB24
25.0000 mg | ORAL_TABLET | Freq: Every day | ORAL | Status: DC
Start: 2013-01-04 — End: 2013-01-04
  Administered 2013-01-04: 25 mg via ORAL
  Filled 2013-01-03: qty 1

## 2013-01-03 MED ORDER — EZETIMIBE-SIMVASTATIN 10-40 MG PO TABS
1.0000 | ORAL_TABLET | Freq: Every day | ORAL | Status: DC
  Administered 2013-01-03: 1 via ORAL
  Filled 2013-01-03 (×2): qty 1

## 2013-01-03 MED ORDER — FINASTERIDE 5 MG PO TABS
5.0000 mg | ORAL_TABLET | Freq: Every day | ORAL | Status: DC
  Administered 2013-01-04: 5 mg via ORAL
  Filled 2013-01-03: qty 1

## 2013-01-03 MED ORDER — FOLIC ACID 800 MCG PO TABS: 1600.0000 ug | ORAL_TABLET | Freq: Every day | ORAL | Status: DC

## 2013-01-03 MED ORDER — NIACIN ER 500 MG PO CPCR
1000.0000 mg | ORAL_CAPSULE | Freq: Every day | ORAL | Status: DC
  Administered 2013-01-03: 1000 mg via ORAL
  Filled 2013-01-03 (×2): qty 2

## 2013-01-03 MED ORDER — ACETAMINOPHEN 325 MG PO TABS
650.0000 mg | ORAL_TABLET | Freq: Four times a day (QID) | ORAL | Status: DC | PRN
Start: 2013-01-03 — End: 2013-01-04

## 2013-01-03 MED ORDER — ONDANSETRON HCL 4 MG/2ML IJ SOLN: 4.0000 mg | Freq: Four times a day (QID) | INTRAMUSCULAR | Status: DC | PRN

## 2013-01-03 MED ORDER — TICAGRELOR 90 MG PO TABS
90.0000 mg | ORAL_TABLET | Freq: Two times a day (BID) | ORAL | Status: DC
  Administered 2013-01-03 – 2013-01-04 (×2): 90 mg via ORAL
  Filled 2013-01-03 (×3): qty 1

## 2013-01-03 MED ORDER — ENOXAPARIN SODIUM 40 MG/0.4ML ~~LOC~~ SOLN
40.0000 mg | SUBCUTANEOUS | Status: DC
  Administered 2013-01-03: 40 mg via SUBCUTANEOUS
  Filled 2013-01-03 (×2): qty 0.4

## 2013-01-03 MED ORDER — PANTOPRAZOLE SODIUM 40 MG PO TBEC
40.0000 mg | DELAYED_RELEASE_TABLET | Freq: Every day | ORAL | Status: DC
  Administered 2013-01-04: 40 mg via ORAL
  Filled 2013-01-03: qty 1

## 2013-01-03 MED ORDER — NITROGLYCERIN 0.4 MG/SPRAY TL SOLN
1.0000 | Status: DC | PRN
  Filled 2013-01-03: qty 4.9

## 2013-01-03 MED ORDER — ASPIRIN EC 81 MG PO TBEC
81.0000 mg | DELAYED_RELEASE_TABLET | Freq: Every day | ORAL | Status: DC
  Administered 2013-01-04: 81 mg via ORAL
  Filled 2013-01-03: qty 1

## 2013-01-03 MED ORDER — FOLIC ACID 1 MG PO TABS
1.0000 mg | ORAL_TABLET | Freq: Every day | ORAL | Status: DC
  Administered 2013-01-04: 1 mg via ORAL
  Filled 2013-01-03: qty 1

## 2013-01-03 MED ORDER — ASPIRIN 325 MG PO TABS
325.0000 mg | ORAL_TABLET | Freq: Every day | ORAL | Status: DC
  Filled 2013-01-03: qty 1

## 2013-01-03 MED ORDER — ASPIRIN 81 MG PO CHEW
243.0000 mg | CHEWABLE_TABLET | Freq: Once | ORAL | Status: AC
  Administered 2013-01-03: 243 mg via ORAL
  Filled 2013-01-03: qty 3

## 2013-01-03 MED ORDER — NIACIN ER (ANTIHYPERLIPIDEMIC) 1000 MG PO TBCR: 1000.0000 mg | EXTENDED_RELEASE_TABLET | Freq: Every day | ORAL | Status: DC

## 2013-01-03 NOTE — H&P (Addendum)
[facility] History and Physical  FURIOUS CHIARELLI EAV:[id number] DOB: 1945-03-19 DOA: 01/03/2013  Referring physician: EDP PCP: No primary provider on file.  Specialists: cardiologist Dr. Eldridge Dace Chief Complaint: chest pain  HPI: BRYLEY KOVACEVIC is a 67 y.o. male with past medical history of extensive CAD, multiple stents in all coronaries. In May 2012 had stents to the RCA, subsequently he has been chest pain and symptom free for 2-1/2 years. Today was assembling a crib and started experiencing chest pressure radiating to the left arm. This is associated with some palpitations, no shortness of breath no nausea vomiting. Decided to come to the emergency room, on route to come nitroglycerin spray, initially it didn't seem to help much but subsequently slowly his chest pain has been significantly improved and almost resolved. In the ER, EKG benign and cardiac enzymes negative x1. Cardiology fellow on call was called by EDP who recommended admission per hospitalist.    Review of Systems: The patient denies anorexia, fever, weight loss,, vision loss, decreased hearing, hoarseness, chest pain, syncope, dyspnea on exertion, peripheral edema, balance deficits, hemoptysis, abdominal pain, melena, hematochezia, severe indigestion/heartburn, hematuria, incontinence, genital sores, muscle weakness, suspicious skin lesions, transient blindness, difficulty walking, depression, unusual weight change, abnormal bleeding, enlarged lymph nodes, angioedema, and breast masses.    Past Medical History  Diagnosis Date  . Hypertension    Past Surgical History  Procedure Laterality Date  . Heart stent  x 7  . Tonsillectomy    . Appendectomy     Social History:  reports that he has never smoked. He does not have any smokeless tobacco history on file. He reports that he drinks alcohol. He reports that he does not use illicit drugs. Lives at home with his wife, but still working, independent in all  ADLs  Allergies  Allergen Reactions  . Penicillins     No family history on file.  Strong family history of CAD with CABG in  father, and brother  Prior to Admission medications   Medication Sig Start Date End Date Taking? Authorizing Provider  aspirin EC 81 MG tablet Take 81 mg by mouth daily.   Yes Historical Provider, MD  ezetimibe-simvastatin (VYTORIN) 10-40 MG per tablet Take 1 tablet by mouth daily at 6 PM.    Yes Historical Provider, MD  finasteride (PROSCAR) 5 MG tablet Take 5 mg by mouth daily.   Yes Historical Provider, MD  folic acid (FOLVITE) 800 MCG tablet Take 1,600 mcg by mouth daily.   Yes Historical Provider, MD  metoprolol succinate (TOPROL-XL) 25 MG 24 hr tablet Take 25 mg by mouth daily.   Yes Historical Provider, MD  niacin (NIASPAN) 1000 MG CR tablet Take 1,000 mg by mouth at bedtime.   Yes Historical Provider, MD  nitroGLYCERIN (NITROLINGUAL) 0.4 MG/SPRAY spray Place 1 spray under the tongue every 5 (five) minutes x 3 doses as needed for chest pain.   Yes Historical Provider, MD  pantoprazole (PROTONIX) 40 MG tablet Take 40 mg by mouth daily.   Yes Historical Provider, MD  Ticagrelor (BRILINTA) 90 MG TABS tablet Take 90 mg by mouth 2 (two) times daily.   Yes Historical Provider, MD   Physical Exam: Filed Vitals:   01/03/13 1730  BP: 153/91  Pulse: 69  Temp:   Resp: 14     General: alert awake oriented x3 no distress exam HEENT: PERRLA, EOMI  CVS was regular rate rhythm no murmurs rubs or gallops  Lungs clear to auscultation bilaterally  Abdomen  soft nontender normal bowel sounds no organomegaly  Skin no rashes or skin breakdown  Musculoskeletal note edema clubbing or cyanosis extent psychiatric appropriate mood and affect Neurological nonfocal  Labs on Admission:  Basic Metabolic Panel:  Recent Labs Lab 01/03/13 1715  NA 140  K 4.1  CL 106  CO2 22  GLUCOSE 103*  BUN 21  CREATININE 1.27  CALCIUM 9.1   Liver Function Tests: No results  found for this basename: AST, ALT, ALKPHOS, BILITOT, PROT, ALBUMIN,  in the last 168 hours No results found for this basename: LIPASE, AMYLASE,  in the last 168 hours No results found for this basename: AMMONIA,  in the last 168 hours CBC:  Recent Labs Lab 01/03/13 1715  WBC 7.4  HGB 14.7  HCT 40.7  MCV 89.8  PLT 177   Cardiac Enzymes: No results found for this basename: CKTOTAL, CKMB, CKMBINDEX, TROPONINI,  in the last 168 hours  BNP (last 3 results) No results found for this basename: PROBNP,  in the last 8760 hours CBG: No results found for this basename: GLUCAP,  in the last 168 hours  Radiological Exams on Admission: Dg Chest 2 View (if Patient Has Fever And/or Copd)  01/03/2013   CLINICAL DATA:  Chest pain with weakness and shortness of breath today. History of hypertension.  EXAM: CHEST  2 VIEW  COMPARISON:  Portable chest 11/15/2003.  FINDINGS: The heart size and mediastinal contours are stable. There is coronary artery atherosclerosis and a probable stent. Fibrotic changes are present at both lung bases, mildly progressive compared with the prior study. No confluent airspace opacity, edema or significant pleural effusion is seen. The osseous structures appear unchanged.  IMPRESSION: Mildly progressive basilar fibrosis.  No acute findings identified.   Electronically Signed   By: Roxy Horseman M.D.   On: 01/03/2013 18:14    EKG: Independently reviewed. No acute ST-T wave changes  Assessment/Plan Active Problems:   Chest pain   CAD S/P percutaneous coronary angioplasty   1. Chest pain, typical -Improved after nitroglycerin -EKG benign and cardiac enzymes x1 negative -Given history of extensive CAD with numerous PCI/stents -Admit to telemetry, cycle cardiac enzymes -Continue aspirin, Brillinta, metoprolol, statins -cardiology consult requested, will likely need CAth  2. CAD -See above  3. Hypertension -stable continue metoprolol  DVT prophylaxis with  Lovenox  Code Status:full code Family Communication: discuss with wife at bedside Disposition Plan: observation  Time spent: 45 minutes  Ethelyn Cerniglia [facility] Pager [phone]  If 7PM-7AM, please contact night-coverage [email or web address] Password [facility] 01/03/2013, 6:49 PM

## 2013-01-03 NOTE — ED Notes (Signed)
Pt c/o centered chest pressure that radiates to left side of chest and down left arm. Pt denies any other symptoms. Pain occurred while ambulating around his home. Pt took 1 baby ASA today.

## 2013-01-03 NOTE — Consult Note (Signed)
Reason for Consult: chest pain Referring Physician: Dr. Inocente Salles QADIR [name] is an 67 y.o. male.  HPI: Chad [name] is a 67 yo man with PMH of Hypertension, CAD, Dyslipidemia, BPH who has had multiple prior stents most recently in 2012 with multiple stents to his RCA who has been doing well at home without exercise limitations and no angina until today, day of admission. He was building a new crib for his 58 day old granddaughter and spent multiple trips up and down stairs when he noted some pressure and tightness in his substernal chest similar to prior episodes years ago. He rested and the pain finally went away lasting for some time (20?+ minutes) leading to presentation. He received 324 mg aspirin in total and has been chest pain free since. He's been compliant with aspirin/ticagrelor and asymptomatic - walks as far as he likes, stable weight, no change in diet, no issues walking up stairs, no PND/orthopnea. He tells me about every 2 years he ends up getting a LHC.   Past Medical History  Diagnosis Date  . Hypertension     Past Surgical History  Procedure Laterality Date  . Heart stent  x 7  . Tonsillectomy    . Appendectomy      Family history of CAD   Social History:  reports that he has never smoked. He does not have any smokeless tobacco history on file. He reports that he drinks alcohol. He reports that he does not use illicit drugs.  Allergies:  Allergies  Allergen Reactions  . Penicillins     Medications:  I have reviewed the patient's current medications. Prior to Admission:  Prescriptions prior to admission  Medication Sig Dispense Refill  . aspirin EC 81 MG tablet Take 81 mg by mouth daily.      Marland Kitchen ezetimibe-simvastatin (VYTORIN) 10-40 MG per tablet Take 1 tablet by mouth daily at 6 PM.       . finasteride (PROSCAR) 5 MG tablet Take 5 mg by mouth daily.      . folic acid (FOLVITE) 800 MCG tablet Take 1,600 mcg by mouth daily.      . metoprolol succinate (TOPROL-XL) 25  MG 24 hr tablet Take 25 mg by mouth daily.      . niacin (NIASPAN) 1000 MG CR tablet Take 1,000 mg by mouth at bedtime.      . nitroGLYCERIN (NITROLINGUAL) 0.4 MG/SPRAY spray Place 1 spray under the tongue every 5 (five) minutes x 3 doses as needed for chest pain.      . pantoprazole (PROTONIX) 40 MG tablet Take 40 mg by mouth daily.      . Ticagrelor (BRILINTA) 90 MG TABS tablet Take 90 mg by mouth 2 (two) times daily.       Scheduled: . [START ON 01/04/2013] aspirin EC  81 mg Oral Daily  . enoxaparin (LOVENOX) injection  40 mg Subcutaneous Q24H  . [START ON 01/04/2013] ezetimibe-simvastatin  1 tablet Oral q1800  . [START ON 01/04/2013] finasteride  5 mg Oral Daily  . [START ON 01/04/2013] folic acid  1 mg Oral Daily  . [START ON 01/04/2013] metoprolol succinate  25 mg Oral Daily  . niacin  1,000 mg Oral QHS  . [START ON 01/04/2013] pantoprazole  40 mg Oral Daily  . Ticagrelor  90 mg Oral BID    Results for orders placed during the hospital encounter of 01/03/13 (from the past 48 hour(s))  CBC     Status: Abnormal  Collection Time    01/03/13  5:15 PM      Result Value Range   WBC 7.4  4.0 - 10.5 K/uL   RBC 4.53  4.22 - 5.81 MIL/uL   Hemoglobin 14.7  13.0 - 17.0 g/dL   HCT 16.1  09.6 - 04.5 %   MCV 89.8  78.0 - 100.0 fL   MCH 32.5  26.0 - 34.0 pg   MCHC 36.1 (*) 30.0 - 36.0 g/dL   RDW 40.9  81.1 - 91.4 %   Platelets 177  150 - 400 K/uL  BASIC METABOLIC PANEL     Status: Abnormal   Collection Time    01/03/13  5:15 PM      Result Value Range   Sodium 140  135 - 145 mEq/L   Potassium 4.1  3.5 - 5.1 mEq/L   Chloride 106  96 - 112 mEq/L   CO2 22  19 - 32 mEq/L   Glucose, Bld 103 (*) 70 - 99 mg/dL   BUN 21  6 - 23 mg/dL   Creatinine, Ser 7.82  0.50 - 1.35 mg/dL   Calcium 9.1  8.4 - 95.6 mg/dL   GFR calc non Af Amer 57 (*) >90 mL/min   GFR calc Af Amer 66 (*) >90 mL/min   Comment: (NOTE)     The eGFR has been calculated using the CKD EPI equation.     This calculation has  not been validated in all clinical situations.     eGFR's persistently <90 mL/min signify possible Chronic Kidney     Disease.  POCT I-STAT TROPONIN I     Status: None   Collection Time    01/03/13  5:35 PM      Result Value Range   Troponin i, poc 0.00  0.00 - 0.08 ng/mL   Comment 3            Comment: Due to the release kinetics of cTnI,     a negative result within the first hours     of the onset of symptoms does not rule out     myocardial infarction with certainty.     If myocardial infarction is still suspected,     repeat the test at appropriate intervals.  TROPONIN I     Status: None   Collection Time    01/03/13  8:40 PM      Result Value Range   Troponin I <0.30  <0.30 ng/mL   Comment:            Due to the release kinetics of cTnI,     a negative result within the first hours     of the onset of symptoms does not rule out     myocardial infarction with certainty.     If myocardial infarction is still suspected,     repeat the test at appropriate intervals.    Dg Chest 2 View (if Patient Has Fever And/or Copd)  01/03/2013   CLINICAL DATA:  Chest pain with weakness and shortness of breath today. History of hypertension.  EXAM: CHEST  2 VIEW  COMPARISON:  Portable chest 11/15/2003.  FINDINGS: The heart size and mediastinal contours are stable. There is coronary artery atherosclerosis and a probable stent. Fibrotic changes are present at both lung bases, mildly progressive compared with the prior study. No confluent airspace opacity, edema or significant pleural effusion is seen. The osseous structures appear unchanged.  IMPRESSION: Mildly progressive basilar fibrosis.  No acute  findings identified.   Electronically Signed   By: Roxy Horseman M.D.   On: 01/03/2013 18:14    Review of Systems  Constitutional: Negative for fever, chills and weight loss.  HENT: Negative for hearing loss and tinnitus.   Eyes: Negative for blurred vision, photophobia and pain.  Respiratory:  Negative for cough, hemoptysis and sputum production.   Cardiovascular: Positive for chest pain. Negative for palpitations and orthopnea.  Gastrointestinal: Negative for nausea, vomiting and diarrhea.  Genitourinary: Negative for dysuria and urgency.  Musculoskeletal: Negative for back pain, myalgias and neck pain.  Skin: Negative for rash.  Neurological: Negative for dizziness, tingling, tremors and headaches.  Endo/Heme/Allergies: Does not bruise/bleed easily.  Psychiatric/Behavioral: Negative for depression, suicidal ideas and substance abuse.   Blood pressure 137/85, pulse 65, temperature 98.3 F (36.8 C), resp. rate 20, height 5\' 10"  (1.778 m), weight 100.154 kg (220 lb 12.8 oz), SpO2 98.00%. Physical Exam  Nursing note and vitals reviewed. Constitutional: He is oriented to person, place, and time. He appears well-developed and well-nourished. No distress.  HENT:  Head: Normocephalic and atraumatic.  Nose: Nose normal.  Mouth/Throat: Oropharynx is clear and moist. No oropharyngeal exudate.  Eyes: Conjunctivae and EOM are normal. Pupils are equal, round, and reactive to light. No scleral icterus.  Neck: Normal range of motion. Neck supple. No JVD present. No tracheal deviation present.  Cardiovascular: Normal rate, regular rhythm, normal heart sounds and intact distal pulses.  Exam reveals no gallop and no friction rub.   No murmur heard. Respiratory: Effort normal and breath sounds normal. No respiratory distress. He has no wheezes. He has no rales.  GI: Soft. Bowel sounds are normal. He exhibits no distension. There is no tenderness. There is no rebound.  Musculoskeletal: Normal range of motion. He exhibits no edema and no tenderness.  Neurological: He is alert and oriented to person, place, and time. No cranial nerve deficit.  Skin: Skin is warm and dry. No rash noted. He is not diaphoretic. No erythema.  Psychiatric: He has a normal mood and affect. His behavior is normal.    Labs reviewed wbc 7.4, h/h 14.7/40.7, plt 177, na 140, K 4.1, bun/cr 21/1.27, Trop 0.00 EKG NSR  Problem List Chest Pain - angina Known CAD with prior stents Dyslipidemia Hypertension GERD  Assessment/Plan: 67 yo man with known CAD here with new chest pain concerning for angina. Typical CP with pain with activity, substernal, improves with rest (and NTG). He's on good medications. Although a functional study would be reasonable to guide therapy, I think it is probably likely he will need LHC and he'd feel better just going straight to [facility]. NPO after MN for likely LHC.  - continue aspirin, ticagrelor, toprol XL - hold lovenox for 24 hours after LHC - on vytorin 10/40 mg - NPO, LHC in AM likely (vs. Potential functional study based on cath film review, etc)   Chad [name] 01/03/2013, 11:38 PM

## 2013-01-03 NOTE — ED Provider Notes (Signed)
CSN: [id number]     Arrival date & time 01/03/13  1700 History   First MD Initiated Contact with Patient 01/03/13 1706     Chief Complaint  Patient presents with  . Chest Pain   (Consider location/radiation/quality/duration/timing/severity/associated sxs/prior Treatment) The history is provided by the patient.  Chad [name] is a 67 y.o. male history hypertension, CAD status post 7 stents here presenting with chest pain. Left-sided chest pain while he was trying to assemble a crib. Lasted several minutes until he took a nitroglycerin. The pain radiates down his left arm but is not associated with any shortness of breath. Similar to his previous heart attacks in the past.    Past Medical History  Diagnosis Date  . Hypertension    Past Surgical History  Procedure Laterality Date  . Heart stent  x 7  . Tonsillectomy    . Appendectomy     No family history on file. History  Substance Use Topics  . Smoking status: Never Smoker   . Smokeless tobacco: Not on file  . Alcohol Use: Yes     Comment: social    Review of Systems  Cardiovascular: Positive for chest pain.  All other systems reviewed and are negative.    Allergies  Penicillins  Home Medications  No current outpatient prescriptions on file. BP 153/91  Pulse 69  Temp(Src) 97.8 F (36.6 C)  Resp 14  Ht 5\' 10"  (1.778 m)  Wt 220 lb (99.791 kg)  BMI 31.57 kg/m2  SpO2 98% Physical Exam  Nursing note and vitals reviewed. Constitutional: He is oriented to person, place, and time. He appears well-developed and well-nourished.  NAD   HENT:  Head: Normocephalic.  Mouth/Throat: Oropharynx is clear and moist.  Eyes: Conjunctivae are normal. Pupils are equal, round, and reactive to light.  Neck: Normal range of motion. Neck supple.  Cardiovascular: Normal rate, regular rhythm and normal heart sounds.   Pulmonary/Chest: Effort normal and breath sounds normal. No respiratory distress. He has no wheezes. He has no  rales.  Abdominal: Soft. Bowel sounds are normal. He exhibits no distension. There is no tenderness. There is no rebound and no guarding.  Musculoskeletal: Normal range of motion. He exhibits no edema.  Neurological: He is alert and oriented to person, place, and time.  Skin: Skin is warm and dry.  Psychiatric: He has a normal mood and affect. His behavior is normal. Judgment and thought content normal.    ED Course  Procedures (including critical care time) Labs Review Labs Reviewed  CBC - Abnormal; Notable for the following:    MCHC 36.1 (*)    All other components within normal limits  BASIC METABOLIC PANEL - Abnormal; Notable for the following:    Glucose, Bld 103 (*)    GFR calc non Af Amer 57 (*)    GFR calc Af Amer 66 (*)    All other components within normal limits  POCT I-STAT TROPONIN I   Imaging Review Dg Chest 2 View (if Patient Has Fever And/or Copd)  01/03/2013   CLINICAL DATA:  Chest pain with weakness and shortness of breath today. History of hypertension.  EXAM: CHEST  2 VIEW  COMPARISON:  Portable chest 11/15/2003.  FINDINGS: The heart size and mediastinal contours are stable. There is coronary artery atherosclerosis and a probable stent. Fibrotic changes are present at both lung bases, mildly progressive compared with the prior study. No confluent airspace opacity, edema or significant pleural effusion is seen. The osseous structures  appear unchanged.  IMPRESSION: Mildly progressive basilar fibrosis.  No acute findings identified.   Electronically Signed   By: Roxy Horseman M.D.   On: 01/03/2013 18:14    EKG Interpretation    Date/Time:  Sunday January 03 2013 17:01:51 EST Ventricular Rate:  80 PR Interval:  152 QRS Duration: 84 QT Interval:  388 QTC Calculation: 447 R Axis:   18 Text Interpretation:  Normal sinus rhythm Normal ECG No significant change since last tracing Confirmed by Liesl Simons  MD, Teon Hudnall [phone]) on 01/03/2013 5:06:50 PM            MDM  No  diagnosis found. Chad [name] is a 67 y.o. male here with chest pain. High risk for ACS. Will get labs but will need admission for further workup.   6:25 PM Trop neg x 1. I called Dr. Tresa Endo from cardiology, who recommend obs for r/o under medicine. I called Dr. Jomarie Longs, who accepted him on tele.    Richardean Canal, MD 01/03/13 [phone]

## 2013-01-04 ENCOUNTER — Encounter (HOSPITAL_COMMUNITY): Payer: Self-pay | Admitting: Interventional Cardiology

## 2013-01-04 ENCOUNTER — Telehealth: Payer: Self-pay | Admitting: Interventional Cardiology

## 2013-01-04 DIAGNOSIS — I10 Essential (primary) hypertension: Secondary | ICD-10-CM

## 2013-01-04 DIAGNOSIS — I2510 Atherosclerotic heart disease of native coronary artery without angina pectoris: Secondary | ICD-10-CM | POA: Insufficient documentation

## 2013-01-04 DIAGNOSIS — E782 Mixed hyperlipidemia: Secondary | ICD-10-CM | POA: Insufficient documentation

## 2013-01-04 LAB — TROPONIN I: Troponin I: <0.3 ng/mL (ref <0.30)

## 2013-01-04 LAB — BASIC METABOLIC PANEL
BUN: 21 mg/dL (ref 6–23)
CO2: 24 mEq/L (ref 19–32)
Chloride: 104 mEq/L (ref 96–112)
GFR calc non Af Amer: 71 mL/min — ABNORMAL LOW (ref >90)
Glucose, Bld: 91 mg/dL (ref 70–99)
Potassium: 3.8 mEq/L (ref 3.5–5.1)
Sodium: 138 mEq/L (ref 135–145)

## 2013-01-04 LAB — CBC
HCT: 36.8 % — ABNORMAL LOW (ref 39.0–52.0)
Hemoglobin: 13 g/dL (ref 13.0–17.0)
MCHC: 35.3 g/dL (ref 30.0–36.0)

## 2013-01-04 MED ORDER — ISOSORBIDE MONONITRATE ER 30 MG PO TB24: 30.0000 mg | ORAL_TABLET | Freq: Every day | ORAL | Status: DC

## 2013-01-04 MED ORDER — ISOSORBIDE MONONITRATE ER 30 MG PO TB24
30.0000 mg | ORAL_TABLET | Freq: Every day | ORAL | Status: DC
  Administered 2013-01-04: 30 mg via ORAL
  Filled 2013-01-04: qty 1

## 2013-01-04 NOTE — Telephone Encounter (Signed)
New message     Patient was released from the hosp today---want new medication to be called in to [facility].

## 2013-01-04 NOTE — Progress Notes (Signed)
[facility] PROGRESS NOTE      Chad [name] ZOX:[id number] DOB: 1945-05-04 DOA: 01/03/2013 PCP: No primary provider on file.  HPI: Chad [name] is a 67 y.o. male with past medical history of extensive CAD, multiple stents in all coronaries. In May 2012 had stents to the RCA, subsequently he has been chest pain and symptom free for 2-1/2 years.  Today was assembling a crib and started experiencing chest pressure radiating to the left arm.  This is associated with some palpitations, no shortness of breath no nausea vomiting.  Decided to come to the emergency room, on route to come nitroglycerin spray, initially it didn't seem to help much but subsequently slowly his chest pain has been significantly improved and almost resolved.  In the ER, EKG benign and cardiac enzymes negative x1. Cardiology fellow on call was called by EDP who recommended admission per hospitalist.  Assessment/Plan: Typical chest pain - CE negative x 3, chest pain resolved. Plan for stress test in am per cardiology CAD - continue home medications HTN - continue home meds  Diet: heart Fluids: none DVT Prophylaxis: Lovenox  Code Status: Full Family Communication: wife bedside  Disposition Plan: stress test in am  Consultants:  Cardiology   Procedures:  none   Antibiotics  Anti-infectives   None     Antibiotics Given (last 72 hours)   None      HPI/Subjective: - feeling well, no chest pain  Objective: Filed Vitals:   01/03/13 1730 01/03/13 1930 01/04/13 0500 01/04/13 1024  BP: 153/91 137/85 125/77 151/71  Pulse: 69 65 70 62  Temp:  98.3 F (36.8 C) 98 F (36.7 C)   Resp: 14 20 20    Height:  5\' 10"  (1.778 m)    Weight:  100.154 kg (220 lb 12.8 oz)    SpO2: 98% 98% 96%    No intake or output data in the 24 hours ending 01/04/13 1339 Filed Weights   01/03/13 1702 01/03/13 1930  Weight: 99.791 kg (220 lb) 100.154 kg (220 lb 12.8 oz)    Exam:   General:   NAD  Cardiovascular: regular rate and rhythm, without MRG  Respiratory: good air movement, clear to auscultation throughout, no wheezing, ronchi or rales  Abdomen: soft, not tender to palpation, positive bowel sounds  MSK: no peripheral edema  Neuro: CN 2-12 grossly intact, MS 5/5 in all 4  Data Reviewed: Basic Metabolic Panel:  Recent Labs Lab 01/03/13 1715 01/04/13 0200  NA 140 138  K 4.1 3.8  CL 106 104  CO2 22 24  GLUCOSE 103* 91  BUN 21 21  CREATININE 1.27 1.06  CALCIUM 9.1 8.8   Liver Function Tests: No results found for this basename: AST, ALT, ALKPHOS, BILITOT, PROT, ALBUMIN,  in the last 168 hours No results found for this basename: LIPASE, AMYLASE,  in the last 168 hours No results found for this basename: AMMONIA,  in the last 168 hours CBC:  Recent Labs Lab 01/03/13 1715 01/04/13 0200  WBC 7.4 4.9  HGB 14.7 13.0  HCT 40.7 36.8*  MCV 89.8 87.6  PLT 177 152   Cardiac Enzymes:  Recent Labs Lab 01/03/13 2040 01/04/13 0200 01/04/13 0810  TROPONINI <0.30 <0.30 <0.30   BNP (last 3 results) No results found for this basename: PROBNP,  in the last 8760 hours CBG: No results found for this basename: GLUCAP,  in the last 168 hours  No results found for this or any previous visit (from the past  240 hour(s)).   Studies: Dg Chest 2 View (if Patient Has Fever And/or Copd)  01/03/2013   CLINICAL DATA:  Chest pain with weakness and shortness of breath today. History of hypertension.  EXAM: CHEST  2 VIEW  COMPARISON:  Portable chest 11/15/2003.  FINDINGS: The heart size and mediastinal contours are stable. There is coronary artery atherosclerosis and a probable stent. Fibrotic changes are present at both lung bases, mildly progressive compared with the prior study. No confluent airspace opacity, edema or significant pleural effusion is seen. The osseous structures appear unchanged.  IMPRESSION: Mildly progressive basilar fibrosis.  No acute findings  identified.   Electronically Signed   By: Roxy Horseman M.D.   On: 01/03/2013 18:14    Scheduled Meds: . aspirin EC  81 mg Oral Daily  . enoxaparin (LOVENOX) injection  40 mg Subcutaneous Q24H  . ezetimibe-simvastatin  1 tablet Oral q1800  . finasteride  5 mg Oral Daily  . folic acid  1 mg Oral Daily  . isosorbide mononitrate  30 mg Oral Daily  . metoprolol succinate  25 mg Oral Daily  . niacin  1,000 mg Oral QHS  . pantoprazole  40 mg Oral Daily  . Ticagrelor  90 mg Oral BID   Continuous Infusions:   Active Problems:   Chest pain   CAD S/P percutaneous coronary angioplasty   Time spent: 25  Pamella Pert, MD [facility] Pager [phone]. If 7 PM - 7 AM, please contact night-coverage at [email or web address], password [facility] 01/04/2013, 1:39 PM  LOS: 1 day

## 2013-01-04 NOTE — Discharge Summary (Addendum)
Patient ID: Chad [name] MRN: [id number] DOB/AGE: 66-Jul-1947 66 y.o.  Admit date: 01/03/2013 Discharge date: 01/04/2013  Primary Discharge Diagnosis CAD Secondary Discharge Diagnosis chest pain, hyperlipidemia, hypertension  Significant Diagnostic Studies: none  Consults: None  Hospital Course: 67 year old man with prior coronary artery disease. He has had stents in his RCA system.  He had one episode of chest discomfort while walking in his home. He came to the emergency room. He has not had any further chest discomfort. He is ruled out for MI. He mentions now that he thinks that he got somewhat anxious when he had the chest discomfort. He also had some mild palpitations with this. His symptoms got better as he was on his way to the hospital. Restarted isosorbide. He walked in the halls and had no problems. We discussed options including cath versus stress test. He initially was thinking about her heart catheterization. We discussed the need for adequate medical therapy as well and decided on starting imdur. We will check a stress test to see if he has any inducible ischemia.  Continue meds for hyperlipidemia hypertension. He is to use his nitroglycerin under his tongue if needed. He should not wait for more than a few minutes before using 1. He can use this in addition to the isosorbide. If he has refractory symptoms, he should come back to the emergency room.   Discharge Exam: Blood pressure 151/71, pulse 62, temperature 98 F (36.7 C), resp. rate 20, height 5\' 10"  (1.778 m), weight 220 lb 12.8 oz (100.154 kg), SpO2 96.00%.   [place]/AT RRR S1S2 CTA bilateral No edema 2+ right radial pulse Labs:   Lab Results  Component Value Date   WBC 4.9 01/04/2013   HGB 13.0 01/04/2013   HCT 36.8* 01/04/2013   MCV 87.6 01/04/2013   PLT 152 01/04/2013     Recent Labs Lab 01/04/13 0200  NA 138  K 3.8  CL 104  CO2 24  BUN 21  CREATININE 1.06  CALCIUM 8.8  GLUCOSE 91   Lab  Results  Component Value Date   CKTOTAL 177 07/05/2010   CKMB 3.3 07/05/2010   TROPONINI <0.30 01/04/2013    No results found for this basename: CHOL   No results found for this basename: HDL   No results found for this basename: LDLCALC   No results found for this basename: TRIG   No results found for this basename: CHOLHDL   No results found for this basename: LDLDIRECT      Radiology: Basilar pulmonary fibrosis noted EKG: Normal sinus rhythm, no ST segment changes  FOLLOW UP PLANS AND APPOINTMENTS      Future Appointments Provider Department Dept Phone   01/06/2013 12:00 PM [facility] 2 Irven Shelling) [facility] SITE 3 NUCLEAR MED [phone]   01/12/2013 3:45 PM Everette Rank, MD [facility] [phone]   02/01/2013 7:40 AM [facility] [place] Office [phone]       Medication List         aspirin EC 81 MG tablet  Take 81 mg by mouth daily.     ezetimibe-simvastatin 10-40 MG per tablet  Commonly known as:  VYTORIN  Take 1 tablet by mouth daily at 6 PM.     finasteride 5 MG tablet  Commonly known as:  PROSCAR  Take 5 mg by mouth daily.     folic acid 800 MCG tablet  Commonly known as:  FOLVITE  Take 1,600 mcg by  mouth daily.     isosorbide mononitrate 30 MG 24 hr tablet  Commonly known as:  IMDUR  Take 1 tablet (30 mg total) by mouth daily.     metoprolol succinate 25 MG 24 hr tablet  Commonly known as:  TOPROL-XL  Take 25 mg by mouth daily.     niacin 1000 MG CR tablet  Commonly known as:  NIASPAN  Take 1,000 mg by mouth at bedtime.     nitroGLYCERIN 0.4 MG/SPRAY spray  Commonly known as:  NITROLINGUAL  Place 1 spray under the tongue every 5 (five) minutes x 3 doses as needed for chest pain.     pantoprazole 40 MG tablet  Commonly known as:  PROTONIX  Take 40 mg by mouth daily.     Ticagrelor 90 MG Tabs tablet  Commonly known as:  BRILINTA  Take 90 mg by mouth 2 (two) times daily.        Follow-up Information   Follow up with [name]., MD On 01/06/2013. (stress test, 12 PM)    Specialty:  Cardiology   Contact information:   [address] [place] [address] [phone]       BRING ALL MEDICATIONS WITH YOU TO FOLLOW UP APPOINTMENTS  Time spent with patient to include physician time: 40 minutes going over all the options of coming up with a plan of care with the patient and his wife. SignedCorky Crafts. 01/04/2013, 1:39 PM

## 2013-01-04 NOTE — Progress Notes (Signed)
UR completed 

## 2013-01-05 MED ORDER — ISOSORBIDE MONONITRATE ER 30 MG PO TB24: 30.0000 mg | ORAL_TABLET | Freq: Every day | ORAL | Status: DC

## 2013-01-05 NOTE — Telephone Encounter (Signed)
Refill sent.

## 2013-01-06 ENCOUNTER — Ambulatory Visit (HOSPITAL_COMMUNITY): Payer: 59 | Attending: Cardiology | Admitting: Radiology

## 2013-01-06 ENCOUNTER — Encounter: Payer: Self-pay | Admitting: Cardiology

## 2013-01-06 VITALS — BP 136/86 | Ht 70.0 in | Wt 229.0 lb

## 2013-01-06 DIAGNOSIS — I2510 Atherosclerotic heart disease of native coronary artery without angina pectoris: Secondary | ICD-10-CM

## 2013-01-06 DIAGNOSIS — R002 Palpitations: Secondary | ICD-10-CM | POA: Insufficient documentation

## 2013-01-06 DIAGNOSIS — E785 Hyperlipidemia, unspecified: Secondary | ICD-10-CM | POA: Insufficient documentation

## 2013-01-06 DIAGNOSIS — R079 Chest pain, unspecified: Secondary | ICD-10-CM

## 2013-01-06 DIAGNOSIS — I10 Essential (primary) hypertension: Secondary | ICD-10-CM | POA: Insufficient documentation

## 2013-01-06 MED ORDER — TECHNETIUM TC 99M SESTAMIBI GENERIC - CARDIOLITE
11.0000 | Freq: Once | INTRAVENOUS | Status: AC | PRN
  Administered 2013-01-06: 11 via INTRAVENOUS

## 2013-01-06 MED ORDER — TECHNETIUM TC 99M SESTAMIBI GENERIC - CARDIOLITE
33.0000 | Freq: Once | INTRAVENOUS | Status: AC | PRN
  Administered 2013-01-06: 33 via INTRAVENOUS

## 2013-01-06 NOTE — Progress Notes (Signed)
[facility] SITE 3 NUCLEAR MED [address] [place], [place] [address] [phone]    Cardiology Nuclear Med Study  Chad [name] is a 67 y.o. male     MRN : [id number]     DOB: Jun 15, 1945  Procedure Date: 01/06/2013  Nuclear Med Background Indication for Stress Test:  Evaluation for Ischemia, Stent Patency, and Patient seen in hospital on 01-03-13 for Chest Pain, enzymes negative History:  MPI: 2010 mild to mod ischemia EF: 74% Cath-multiples in all coronaries 7 total  RCA Cardiac Risk Factors: Family History - CAD, Hypertension and Lipids  Symptoms:  Chest Pain with Exertion and Palpitations   Nuclear Pre-Procedure Caffeine/Decaff Intake:  None > 12 hrs NPO After: 9:00am   Lungs:  clear O2 Sat: 96% on room air. IV 0.9% NS with Angio Cath:  20g  IV Site: R Antecubital x 1, tolerated well IV Started by:  Irean Hong, RN  Chest Size (in):  46 Cup Size: n/a  Height: 5\' 10"  (1.778 m)  Weight:  229 lb (103.874 kg)  BMI:  Body mass index is 32.86 kg/(m^2). Tech Comments:  Took Toprol at 0900 today    Nuclear Med Study 1 or 2 day study: 1 day  Stress Test Type:  Stress  Reading MD: Lance Muss, MD  Order Authorizing Provider:  Lance Muss, MD  Resting Radionuclide: Technetium 23m Sestamibi  Resting Radionuclide Dose: 11.0 mCi   Stress Radionuclide:  Technetium 72m Sestamibi  Stress Radionuclide Dose: 33.0 mCi           Stress Protocol Rest HR: 60 Stress HR: 150  Rest BP: 136/86 Stress BP: 189/83  Exercise Time (min): 7:30 METS: 9.3   Predicted Max HR: 154 bpm % Max HR: 97.4 bpm Rate Pressure Product: 62952   Dose of Adenosine (mg):  n/a Dose of Lexiscan: n/a mg  Dose of Atropine (mg): n/a Dose of Dobutamine: n/a mcg/kg/min (at max HR)  Stress Test Technologist: Milana Na, EMT-P  Nuclear Technologist:  Dario Guardian, CNMT     Rest Procedure:  Myocardial perfusion imaging was performed at rest 45 minutes following the intravenous  administration of Technetium 78m Sestamibi. Rest ECG: NSR - Normal EKG PACs  Stress Procedure:  The patient exercised on the treadmill utilizing the Bruce Protocol for 7:30  minutes. The patient stopped due to fatigue and denied any chest pain.  Technetium 11m Sestamibi was injected at peak exercise and myocardial perfusion imaging was performed after a brief delay. Stress ECG: No significant change from baseline ECG  QPS Raw Data Images:  Mild diaphragmatic attenuation.  Normal left ventricular size. Stress Images:  There is mild apical thinning with normal uptake in other regions. Rest Images:  There is mild apical thinning with normal uptake in other regions. Subtraction (SDS):  No evidence of ischemia. Transient Ischemic Dilatation (Normal <1.22):  0.78 Lung/Heart Ratio (Normal <0.45):  0.38  Quantitative Gated Spect Images QGS EDV:  67 ml QGS ESV:  27 ml  Impression Exercise Capacity:  Fair exercise capacity. BP Response:  Hypertensive blood pressure response. Clinical Symptoms:  No significant symptoms noted. ECG Impression:  There are scattered PVCs.No clear evidence of ischemia. Comparison with Prior Nuclear Study: No images to compare  Overall Impression:  Low risk stress nuclear study No clear evidence of ischemia.. Would consider further workup if symptoms persist.   LV Ejection Fraction: 59%.  LV Wall Motion:  NL LV Function; NL Wall Motion  [name]., MD, [facility]

## 2013-01-12 ENCOUNTER — Ambulatory Visit (INDEPENDENT_AMBULATORY_CARE_PROVIDER_SITE_OTHER): Payer: 59 | Admitting: Interventional Cardiology

## 2013-01-12 ENCOUNTER — Encounter: Payer: Self-pay | Admitting: Interventional Cardiology

## 2013-01-12 VITALS — BP 150/80 | HR 71 | Ht 70.0 in | Wt 233.0 lb

## 2013-01-12 DIAGNOSIS — E782 Mixed hyperlipidemia: Secondary | ICD-10-CM

## 2013-01-12 DIAGNOSIS — I2510 Atherosclerotic heart disease of native coronary artery without angina pectoris: Secondary | ICD-10-CM

## 2013-01-12 MED ORDER — NITROGLYCERIN 0.4 MG SL SUBL: 0.4000 mg | SUBLINGUAL_TABLET | SUBLINGUAL | Status: DC | PRN

## 2013-01-12 NOTE — Patient Instructions (Signed)
Your physician recommends that you schedule a follow-up appointment in 3 months with Dr. Eldridge Dace.   Refilled Nitro to [facility]

## 2013-01-13 NOTE — Progress Notes (Signed)
Patient ID: Chad [name], male   DOB: Jul 12, 1945, 67 y.o.   MRN: [id number]    [address] [place], [place]  [address] Phone: [phone] Fax:  [phone]  Date:  01/13/2013   ID:  AMBER GUTHRIDGE, DOB February 24, 1945, MRN [id number]  PCP:  No primary provider on file.      History of Present Illness: Chad [name] is a 67 y.o. male with known CAD. He had multiple stents to his right coronary artery several years ago. He had an episode of chest discomfort and went to the hospital about a week ago. He ruled out for MI. He had an outpatient stress test which was negative for ischemia. He was started on Imdur were. Since that time, he has done well. He has had a mild headache but it has been decreasing in severity. He has been active at home. He is resuming activities he had prior to his hospital admission. He denies any chest discomfort. He walked over 7 minutes on the treadmill and had no chest discomfort. There is no ECG evidence of ischemia as well.  His wife does report some dissatisfaction with the fact that, every 2 years he is had stents since 2010. She would feel better if he had a heart catheterization.   Wt Readings from Last 3 Encounters:  01/12/13 233 lb (105.688 kg)  01/06/13 229 lb (103.874 kg)  01/03/13 220 lb 12.8 oz (100.154 kg)     Past Medical History  Diagnosis Date  . Hypertension   . Coronary atherosclerosis of native coronary artery   . Mixed hyperlipidemia     Current Outpatient Prescriptions  Medication Sig Dispense Refill  . aspirin EC 81 MG tablet Take 81 mg by mouth daily.      Marland Kitchen ezetimibe-simvastatin (VYTORIN) 10-40 MG per tablet Take 1 tablet by mouth daily at 6 PM.       . finasteride (PROSCAR) 5 MG tablet Take 5 mg by mouth daily.      . folic acid (FOLVITE) 800 MCG tablet Take 1,600 mcg by mouth daily.      . isosorbide mononitrate (IMDUR) 30 MG 24 hr tablet Take 1 tablet (30 mg total) by mouth daily.  15 tablet  0  . metoprolol  succinate (TOPROL-XL) 25 MG 24 hr tablet Take 25 mg by mouth daily.      . niacin (NIASPAN) 1000 MG CR tablet Take 1,000 mg by mouth at bedtime.      . pantoprazole (PROTONIX) 40 MG tablet Take 40 mg by mouth daily.      . Ticagrelor (BRILINTA) 90 MG TABS tablet Take 90 mg by mouth 2 (two) times daily.      . nitroGLYCERIN (NITROSTAT) 0.4 MG SL tablet Place 1 tablet (0.4 mg total) under the tongue every 5 (five) minutes as needed for chest pain.  25 tablet  5   No current facility-administered medications for this visit.    Allergies:    Allergies  Allergen Reactions  . Penicillins     Social History:  The patient  reports that he has never smoked. He does not have any smokeless tobacco history on file. He reports that he drinks alcohol. He reports that he does not use illicit drugs.   Family History:  The patient's family history includes CAD in his brother, father, and mother.   ROS:  Please see the history of present illness.  No nausea, vomiting.  No fevers, chills.  No focal  weakness.  No dysuria.   All other systems reviewed and negative.   PHYSICAL EXAM: VS:  BP 150/80  Pulse 71  Ht 5\' 10"  (1.778 m)  Wt 233 lb (105.688 kg)  BMI 33.43 kg/m2 Well nourished, well developed, in no acute distress HEENT: normal Neck: no JVD, no carotid bruits Cardiac:  normal S1, S2; RRR;  Lungs:  clear to auscultation bilaterally, no wheezing, rhonchi or rales Abd: soft, nontender, no hepatomegaly Ext: no edema Skin: warm and dry Neuro:   no focal abnormalities noted  EKG:  Normal sinus rhythm, no ST segment changes     ASSESSMENT AND PLAN:  1. Coronary artery disease: No angina or any other type of chest pain since his hospitalization. He ruled out for MI and had a very low risk stress test. He had no symptoms with exercise. He is asymptomatic with his regular activities. At this point, I don't think there is indication for any revascularization. Therefore, I have not recommended  diagnostic catheterization. If he develops anymore symptoms, we can reconsider this strategy. As I mentioned above, the wife was somewhat dissatisfied with this approach. She would like for him to have a heart catheterization. I did explain to both the husband and wife current AUC standards for revascularization. At this point, since he is asymptomatic, only have a high-risk anatomy would be appropriate to revascularize such as proximal LAD disease. Based on a stress test, there is no reason to believe that he has such high risk anatomy.  I did offer them the option just to perform a diagnostic cath with the knowledge that a PCI may not be performed for low risk disease. The patient is happy with continuing the current strategy. If his headache gets worse, he will stop the Imdur. If his chest discomfort comes back, I would consider starting Ranexa.  If at any point he changes his mind about a catheterization, we can certainly perform a diagnostic catheterization to verify his coronary anatomy.  Diagnostic Imaging:EKG [name],[name] 01/13/2012 02:02:44 PM > [name],[name] 01/13/2012 02:17:10 PM > Normal  No angina. He was a Plavix nonresponder by blood test in the hospital in 2012. He is comfortable staying on the Brilinta.    2. Pure hypercholesterolemia  Continue Vytorin Tablet, 10/40MG , 1 tablet, Orally, Once a day Continue Niaspan Tablet Extended Release, 1000MG , 1 tablet, Once a day LDL particle number [id number] at last check.    Preventive Medicine  Adult topics discussed:  Diet: healthy diet, low calorie, low fat.  Exercise: at least 30 minutes of aerobic exercise, 5 days a week, Continue present exercise program.      Signed, Fredric Mare, MD, [facility] 01/13/2013 6:36 PM

## 2013-02-01 ENCOUNTER — Other Ambulatory Visit: Payer: 59

## 2013-02-08 ENCOUNTER — Other Ambulatory Visit (INDEPENDENT_AMBULATORY_CARE_PROVIDER_SITE_OTHER): Payer: 59

## 2013-02-08 DIAGNOSIS — Z79899 Other long term (current) drug therapy: Secondary | ICD-10-CM

## 2013-02-08 DIAGNOSIS — E7800 Pure hypercholesterolemia, unspecified: Secondary | ICD-10-CM

## 2013-02-09 LAB — NMR LIPOPROFILE WITH LIPIDS
Cholesterol, Total: 135 mg/dL (ref <200)
HDL Particle Number: 37.8 umol/L (ref >=30.5)
HDL Size: 9.7 nm (ref >=9.2)
HDL-C: 51 mg/dL (ref >=40)
LDL (calc): 61 mg/dL (ref <100)
LP-IR Score: 41 (ref <=45)
Large HDL-P: 4.9 umol/L (ref >=4.8)
Large VLDL-P: 3.4 nmol/L — ABNORMAL HIGH (ref <=2.7)
Small LDL Particle Number: 419 nmol/L (ref <=527)

## 2013-02-16 ENCOUNTER — Telehealth: Payer: Self-pay | Admitting: Cardiology

## 2013-02-16 DIAGNOSIS — E782 Mixed hyperlipidemia: Secondary | ICD-10-CM

## 2013-02-16 NOTE — Telephone Encounter (Signed)
Message copied by Alcario Drought on Tue Feb 16, 2013  9:23 AM ------      Message from: SMART, Maralyn Sago      Created: Mon Feb 15, 2013  9:20 AM       RF:  3 vessel CAD, FmHx CAD, age - LDL goal < 70, non-HDL goal < 100, LDL-P < 1000.      Meds:  Vytorin 10/40 mg, Niaspan 1000 mg qhs.      Excellent NMR panel.       Patient has had multiple DES and LDL-P well controlled, and Lp(a) was checked in past, which was also normal.       ALT normal.      Plan:      1. No change in meds.      2. Check an NMR LipoProfile and hepatic panel in 1 year.      Please notify patient, and set up labs. thanks. ------

## 2013-03-28 ENCOUNTER — Other Ambulatory Visit: Payer: Self-pay | Admitting: Interventional Cardiology

## 2013-04-12 ENCOUNTER — Ambulatory Visit: Payer: 59 | Admitting: Interventional Cardiology

## 2013-04-16 ENCOUNTER — Other Ambulatory Visit: Payer: Self-pay | Admitting: Interventional Cardiology

## 2013-05-04 ENCOUNTER — Other Ambulatory Visit: Payer: Self-pay | Admitting: Interventional Cardiology

## 2013-05-07 ENCOUNTER — Telehealth: Payer: Self-pay | Admitting: Cardiology

## 2013-05-07 ENCOUNTER — Encounter: Payer: Self-pay | Admitting: Interventional Cardiology

## 2013-05-07 ENCOUNTER — Ambulatory Visit (INDEPENDENT_AMBULATORY_CARE_PROVIDER_SITE_OTHER): Payer: 59 | Admitting: Interventional Cardiology

## 2013-05-07 VITALS — BP 148/92 | HR 76 | Ht 70.0 in | Wt 232.4 lb

## 2013-05-07 DIAGNOSIS — E782 Mixed hyperlipidemia: Secondary | ICD-10-CM

## 2013-05-07 DIAGNOSIS — I2510 Atherosclerotic heart disease of native coronary artery without angina pectoris: Secondary | ICD-10-CM

## 2013-05-07 MED ORDER — TICAGRELOR 90 MG PO TABS: 90.0000 mg | ORAL_TABLET | Freq: Two times a day (BID) | ORAL | Status: DC

## 2013-05-07 MED ORDER — ISOSORBIDE MONONITRATE ER 30 MG PO TB24: 15.0000 mg | ORAL_TABLET | Freq: Every day | ORAL | Status: DC

## 2013-05-07 MED ORDER — METOPROLOL SUCCINATE ER 25 MG PO TB24: 25.0000 mg | ORAL_TABLET | Freq: Every day | ORAL | Status: DC

## 2013-05-07 MED ORDER — NIACIN ER (ANTIHYPERLIPIDEMIC) 1000 MG PO TBCR: 1000.0000 mg | EXTENDED_RELEASE_TABLET | Freq: Every day | ORAL | Status: DC

## 2013-05-07 MED ORDER — EZETIMIBE-SIMVASTATIN 10-40 MG PO TABS: 1.0000 | ORAL_TABLET | Freq: Every day | ORAL | Status: DC

## 2013-05-07 NOTE — Telephone Encounter (Signed)
Pt.notified

## 2013-05-07 NOTE — Progress Notes (Signed)
Patient ID: Chad [name], male   DOB: [birth year], 68 y.o.   MRN: [id number] Patient ID: Chad [name], male   DOB: 1946-01-26, 68 y.o.   MRN: [id number]    [place] [place], [place]  [address] Phone: [phone] Fax:  [phone]  Date:  05/07/2013   ID:  Chad [name], DOB 08-25-1945, MRN [id number]  PCP:  Default, Provider, MD      History of Present Illness: Chad [name] is a 68 y.o. male with known CAD. He had multiple stents to his right coronary artery several years ago. He had an episode of chest discomfort and went to the hospital in 11/14. He ruled out for MI. He had an outpatient stress test which was negative for ischemia. He was started on Imdur then. Since that time, he has done well. He has had a mild headache but it has been decreasing in severity. He has been active at home. He is resuming activities he had prior to his hospital admission. He denies any chest discomfort. He walked over 7 minutes on the treadmill and had no chest discomfort. There is no ECG evidence of ischemia as well.  His wife did report some dissatisfaction with the fact that, every 2 years he is had stents since 2010. She would feel better if he had a heart catheterization, back in 12/14.  No recent discomfort.  He has not been as active due to the weather but he has no problems with walking several flights of stairs.   Wt Readings from Last 3 Encounters:  05/07/13 232 lb 6.4 oz (105.416 kg)  01/12/13 233 lb (105.688 kg)  01/06/13 229 lb (103.874 kg)     Past Medical History  Diagnosis Date  . Hypertension   . Coronary atherosclerosis of native coronary artery   . Mixed hyperlipidemia     Current Outpatient Prescriptions  Medication Sig Dispense Refill  . aspirin EC 81 MG tablet Take 81 mg by mouth daily.      Marland Kitchen BRILINTA 90 MG TABS tablet Take 1 tablet by mouth  twice a day (maintenance   aspirin should be less than 100mg  when used with   Brilinta)  60 tablet  0  .  finasteride (PROSCAR) 5 MG tablet Take 5 mg by mouth daily.      . folic acid (FOLVITE) 128 MCG tablet Take 1,600 mcg by mouth daily.      . isosorbide mononitrate (IMDUR) 30 MG 24 hr tablet Take 15 mg by mouth daily.      . metoprolol succinate (TOPROL-XL) 25 MG 24 hr tablet Take 1 tablet by mouth once a day  30 tablet  0  . niacin (NIASPAN) 1000 MG CR tablet Take 1 tablet by mouth once a day  30 tablet  0  . nitroGLYCERIN (NITROSTAT) 0.4 MG SL tablet Place 1 tablet (0.4 mg total) under the tongue every 5 (five) minutes as needed for chest pain.  25 tablet  5  . pantoprazole (PROTONIX) 40 MG tablet Take 40 mg by mouth daily.      Marland Kitchen VYTORIN 10-40 MG per tablet Take 1 tablet by mouth once a day  30 tablet  0   No current facility-administered medications for this visit.    Allergies:    Allergies  Allergen Reactions  . Penicillins     Social History:  The patient  reports that he has never smoked. He does not have any smokeless tobacco history on  file. He reports that he drinks alcohol. He reports that he does not use illicit drugs.   Family History:  The patient's family history includes CAD in his brother, father, and mother.   ROS:  Please see the history of present illness.  No nausea, vomiting.  No fevers, chills.  No focal weakness.  No dysuria.   All other systems reviewed and negative.   PHYSICAL EXAM: VS:  BP 148/92  Pulse 76  Ht 5\' 10"  (1.778 m)  Wt 232 lb 6.4 oz (105.416 kg)  BMI 33.35 kg/m2 Well nourished, well developed, in no acute distress HEENT: normal Neck: no JVD, no carotid bruits Cardiac:  normal S1, S2; RRR;  Lungs:  clear to auscultation bilaterally, no wheezing, rhonchi or rales Abd: soft, nontender, no hepatomegaly Ext: no edema Skin: warm and dry Neuro:   no focal abnormalities noted  EKG:  Normal sinus rhythm, no ST segment changes     ASSESSMENT AND PLAN:  1. Coronary artery disease: No angina or any other type of chest pain since his  hospitalization. He ruled out for MI and had a very low risk stress test. He had no symptoms with exercise. He is asymptomatic with his regular activities. At this point, I don't think there is indication for any revascularization. Therefore, I have not recommended diagnostic catheterization. If he develops anymore symptoms, we can reconsider this strategy. As I mentioned above, the wife was somewhat dissatisfied with this approach in 12/14. She would like for him to have a heart catheterization. I did explain to both the husband and wife current AUC standards for revascularization in 12/14. At this point, since he is asymptomatic, only have a high-risk anatomy would be appropriate to revascularize such as proximal LAD disease. Based on a stress test, there is no reason to believe that he has such high risk anatomy.  I did offer them the option just to perform a diagnostic cath if sx change. The patient is happy with continuing the current strategy. If his headache gets worse, he will stop the Imdur. If his chest discomfort comes back, I would consider starting Ranexa.  If at any point he changes his mind about a catheterization, we can certainly perform a diagnostic catheterization to verify his coronary anatomy.  Diagnostic Imaging:EKG [name],[name] 01/13/2012 02:02:44 PM > VARANASI,[name] 01/13/2012 02:17:10 PM > Normal  No angina. He was a Plavix nonresponder by blood test in the hospital in 2012. He is comfortable staying on the Brilinta.    2. Pure hypercholesterolemia  Continue Vytorin Tablet, 10/40MG , 1 tablet, Orally, Once a day Continue Niaspan Tablet Extended Release, 1000MG , 1 tablet, Once a day LDL particle number [id number] at last check in 12/13.  LDL 114 in 12/14.   Preventive Medicine  Adult topics discussed:  Diet: healthy diet, low calorie, low fat.  Exercise: at least 30 minutes of aerobic exercise, 5 days a week, Continue present exercise program.      Signed, Mina Marble, MD,  [facility] 05/07/2013 9:01 AM

## 2013-05-07 NOTE — Telephone Encounter (Signed)
Ysidro Evert, I just looked at labs and I think Dr. Irish Lack thought pt only had LDL checked, but in reality he had NMR lipo and ALT done. I think pt doesn't need anything until next scheduled check in December.

## 2013-05-07 NOTE — Telephone Encounter (Signed)
Correct, he is good until December.

## 2013-05-07 NOTE — Patient Instructions (Signed)
Your physician recommends that you continue on your current medications as directed. Please refer to the Current Medication list given to you today.    All medications have been refilled.    Your physician wants you to follow-up in: 6 months with Dr. Irish Lack. You will receive a reminder letter in the mail two months in advance. If you don't receive a letter, please call our office to schedule the follow-up appointment.

## 2013-05-07 NOTE — Telephone Encounter (Signed)
Ysidro Evert, please review most recent lipids and let me know about follow up labs.

## 2013-11-12 ENCOUNTER — Ambulatory Visit (INDEPENDENT_AMBULATORY_CARE_PROVIDER_SITE_OTHER): Payer: Medicare HMO | Admitting: Interventional Cardiology

## 2013-11-12 ENCOUNTER — Encounter: Payer: Self-pay | Admitting: Interventional Cardiology

## 2013-11-12 VITALS — BP 130/80 | HR 65 | Ht 70.0 in | Wt 222.0 lb

## 2013-11-12 DIAGNOSIS — R519 Headache, unspecified: Secondary | ICD-10-CM

## 2013-11-12 DIAGNOSIS — I2510 Atherosclerotic heart disease of native coronary artery without angina pectoris: Secondary | ICD-10-CM

## 2013-11-12 DIAGNOSIS — R51 Headache: Secondary | ICD-10-CM

## 2013-11-12 DIAGNOSIS — Z9861 Coronary angioplasty status: Secondary | ICD-10-CM

## 2013-11-12 DIAGNOSIS — E663 Overweight: Secondary | ICD-10-CM

## 2013-11-12 DIAGNOSIS — E782 Mixed hyperlipidemia: Secondary | ICD-10-CM

## 2013-11-12 MED ORDER — EZETIMIBE-SIMVASTATIN 10-40 MG PO TABS: 1.0000 | ORAL_TABLET | Freq: Every day | ORAL | Status: DC

## 2013-11-12 MED ORDER — TICAGRELOR 90 MG PO TABS: 90.0000 mg | ORAL_TABLET | Freq: Two times a day (BID) | ORAL | Status: DC

## 2013-11-12 MED ORDER — METOPROLOL SUCCINATE ER 25 MG PO TB24: 25.0000 mg | ORAL_TABLET | Freq: Every day | ORAL | Status: DC

## 2013-11-12 MED ORDER — ISOSORBIDE MONONITRATE ER 30 MG PO TB24: 15.0000 mg | ORAL_TABLET | Freq: Every day | ORAL | Status: DC

## 2013-11-12 MED ORDER — NITROGLYCERIN 0.4 MG SL SUBL: 0.4000 mg | SUBLINGUAL_TABLET | SUBLINGUAL | Status: DC | PRN

## 2013-11-12 MED ORDER — NIACIN ER (ANTIHYPERLIPIDEMIC) 1000 MG PO TBCR: 1000.0000 mg | EXTENDED_RELEASE_TABLET | Freq: Every day | ORAL | Status: DC

## 2013-11-12 NOTE — Patient Instructions (Signed)
Your physician recommends that you continue on your current medications as directed. Please refer to the Current Medication list given to you today.  Your physician wants you to follow-up in: 1 year with Dr. Varanasi. You will receive a reminder letter in the mail two months in advance. If you don't receive a letter, please call our office to schedule the follow-up appointment.  

## 2013-11-12 NOTE — Progress Notes (Signed)
Patient ID: Chad [name], male   DOB: 11/15/1945, 68 y.o.   MRN: [id number] Patient ID: Chad [name], male   DOB: Jan 16, 1946, 68 y.o.   MRN: [id number] Patient ID: Chad [name], male   DOB: [birth year], 68 y.o.   MRN: [id number]    [place], [place], [place]  [address] Phone: [phone] Fax:  [phone]  Date:  11/12/2013   ID:  Chad [name], DOB May 21, 1945, MRN [id number]  PCP:  Gara Kroner, MD      History of Present Illness: Chad [name] is a 68 y.o. male with known CAD. He had multiple stents to his right coronary artery several years ago. He had an episode of chest discomfort and went to the hospital in 11/14. He ruled out for MI. He had an outpatient stress test which was negative for ischemia. He was started on Imdur then. Since that time, he has done well. He has had a mild headache but it has been decreasing in severity. He has been active at home. He is resuming activities he had prior to his hospital admission. He denies any chest discomfort. He walked over 7 minutes on the treadmill and had no chest discomfort. There is no ECG evidence of ischemia as well.  His wife did report some dissatisfaction with the fact that, every 2 years he is had stents since 2010. She would feel better if he had a heart catheterization, back in 12/14.  Since his sx are controlled, he is comfortable avoiding any ischemia testing.  No recent discomfort.  He has been active redoing a beach house.  No problems with walking several flights of stairs.   Wt Readings from Last 3 Encounters:  11/12/13 222 lb (100.699 kg)  05/07/13 232 lb 6.4 oz (105.416 kg)  01/12/13 233 lb (105.688 kg)     Past Medical History  Diagnosis Date  . Hypertension   . Coronary atherosclerosis of native coronary artery   . Mixed hyperlipidemia     Current Outpatient Prescriptions  Medication Sig Dispense Refill  . aspirin EC 81 MG tablet Take 81 mg by mouth daily.      Marland Kitchen  ezetimibe-simvastatin (VYTORIN) 10-40 MG per tablet Take 1 tablet by mouth daily at 6 PM.  90 tablet  2  . finasteride (PROSCAR) 5 MG tablet Take 5 mg by mouth daily.      . folic acid (FOLVITE) 347 MCG tablet Take 1,600 mcg by mouth daily.      . isosorbide mononitrate (IMDUR) 30 MG 24 hr tablet Take 0.5 tablets (15 mg total) by mouth daily.  45 tablet  2  . metoprolol succinate (TOPROL-XL) 25 MG 24 hr tablet Take 1 tablet (25 mg total) by mouth daily.  90 tablet  2  . niacin (NIASPAN) 1000 MG CR tablet Take 1 tablet (1,000 mg total) by mouth at bedtime.  90 tablet  2  . nitroGLYCERIN (NITROSTAT) 0.4 MG SL tablet Place 1 tablet (0.4 mg total) under the tongue every 5 (five) minutes as needed for chest pain.  25 tablet  5  . pantoprazole (PROTONIX) 40 MG tablet Take 40 mg by mouth daily.      . Ticagrelor (BRILINTA) 90 MG TABS tablet Take 1 tablet (90 mg total) by mouth 2 (two) times daily.  180 tablet  2   No current facility-administered medications for this visit.    Allergies:    Allergies  Allergen Reactions  . Penicillins  Social History:  The patient  reports that he has never smoked. He does not have any smokeless tobacco history on file. He reports that he drinks alcohol. He reports that he does not use illicit drugs.   Family History:  The patient's family history includes CAD in his brother, father, and mother; Heart attack in his father and mother. There is no history of Stroke.   ROS:  Please see the history of present illness.  No nausea, vomiting.  No fevers, chills.  No focal weakness.  No dysuria.   All other systems reviewed and negative. Mild headache with Imdur.   PHYSICAL EXAM: VS:  BP 130/80  Pulse 65  Ht 5\' 10"  (1.778 m)  Wt 222 lb (100.699 kg)  BMI 31.85 kg/m2 Well nourished, well developed, in no acute distress HEENT: normal Neck: no JVD, no carotid bruits Cardiac:  normal S1, S2; RRR;  Lungs:  clear to auscultation bilaterally, no wheezing, rhonchi or  rales Abd: soft, nontender, no hepatomegaly Ext: no edema Skin: warm and dry Neuro:   no focal abnormalities noted  EKG:  Normal sinus rhythm, no ST segment changes     ASSESSMENT AND PLAN:  1. Coronary artery disease: No angina or any other type of chest pain since his hospitalization in 2014. He ruled out for MI and had a very low risk stress test. He had no symptoms with exercise. He is asymptomatic with his regular activities. At this point, I don't think there is indication for any revascularization. Therefore, I have not recommended diagnostic catheterization. If he develops anymore symptoms, we can reconsider this strategy. As I mentioned above, the wife was somewhat dissatisfied with this approach in 12/14. She would have liked for him to have a heart catheterization at that time. Based on the stress test, there is no reason to believe that he has high risk anatomy.  I did offer them the option just to perform a diagnostic cath if sx change. The patient is happy with continuing the current strategy. If his headache gets worse, he will stop the Imdur. If his chest discomfort comes back, I would consider starting Ranexa or amlodipine.  If at any point he changes his mind about a catheterization, we can certainly perform a diagnostic catheterization to verify his coronary anatomy.   No angina. He was a Plavix nonresponder by blood test in the hospital in 2012. He is comfortable staying on the Brilinta. We discussed the 60 mg dose of Brilinta but he dose not want to change at this time.    2. Pure hypercholesterolemia  Continue Vytorin Tablet, 10/40MG , 1 tablet, Orally, Once a day Continue Niaspan Tablet Extended Release, 1000MG , 1 tablet, Once a day LDL particle number [id number] at last check in 12/13.  LDL 114 in 12/14.  Recheck in 12/15.   3. Obesity: He has lost 10 lbs.  Continue efforts to try to lose weight.  4. Headaches: controlled with acetaminophen and related to Imdur.  Preventive  Medicine  Adult topics discussed:  Diet: healthy diet, low calorie, low fat.  Exercise: at least 30 minutes of aerobic exercise, 5 days a week, Continue present exercise program.      Signed, Mina Marble, MD, [facility] 11/12/2013 9:33 AM

## 2013-12-29 ENCOUNTER — Other Ambulatory Visit: Payer: Self-pay | Admitting: Pharmacist

## 2013-12-29 MED ORDER — SIMVASTATIN 40 MG PO TABS: 40.0000 mg | ORAL_TABLET | Freq: Every day | ORAL | Status: DC

## 2013-12-29 MED ORDER — EZETIMIBE 10 MG PO TABS: 10.0000 mg | ORAL_TABLET | Freq: Every day | ORAL | Status: DC

## 2014-02-08 ENCOUNTER — Other Ambulatory Visit: Payer: 59

## 2014-02-09 ENCOUNTER — Other Ambulatory Visit (INDEPENDENT_AMBULATORY_CARE_PROVIDER_SITE_OTHER): Payer: Medicare HMO | Admitting: *Deleted

## 2014-02-09 DIAGNOSIS — E782 Mixed hyperlipidemia: Secondary | ICD-10-CM

## 2014-02-09 LAB — HEPATIC FUNCTION PANEL
ALK PHOS: 45 U/L (ref 39–117)
ALT: 21 U/L (ref 0–53)
AST: 23 U/L (ref 0–37)
Albumin: 4 g/dL (ref 3.5–5.2)
BILIRUBIN DIRECT: 0.1 mg/dL (ref 0.0–0.3)
Total Bilirubin: 0.9 mg/dL (ref 0.2–1.2)
Total Protein: 6.9 g/dL (ref 6.0–8.3)

## 2014-02-11 LAB — NMR LIPOPROFILE WITH LIPIDS
CHOLESTEROL, TOTAL: 182 mg/dL (ref 100–199)
HDL PARTICLE NUMBER: 42 umol/L (ref >=30.5)
HDL Size: 9 nm — ABNORMAL LOW (ref >=9.2)
HDL-C: 65 mg/dL (ref >39)
LARGE HDL: 6.8 umol/L (ref >=4.8)
LARGE VLDL-P: 8.3 nmol/L — AB (ref <=2.7)
LDL (calc): 82 mg/dL (ref 0–99)
LDL PARTICLE NUMBER: 1165 nmol/L — AB (ref <1000)
LDL SIZE: 20.6 nm (ref >=20.8)
LP-IR Score: 62 — ABNORMAL HIGH (ref <=45)
Small LDL Particle Number: 546 nmol/L — ABNORMAL HIGH (ref <=527)
Triglycerides: 177 mg/dL — ABNORMAL HIGH (ref 0–149)
VLDL SIZE: 50.8 nm — AB (ref <=46.6)

## 2014-02-13 ENCOUNTER — Telehealth: Payer: Self-pay | Admitting: Interventional Cardiology

## 2014-02-13 DIAGNOSIS — E785 Hyperlipidemia, unspecified: Secondary | ICD-10-CM

## 2014-02-13 NOTE — Telephone Encounter (Signed)
Please see most recent lipids.  I inadvertently closed encounter.

## 2014-02-22 NOTE — Telephone Encounter (Signed)
RF: 3 vessel CAD, FmHx CAD, age - LDL goal < 70, non-HDL goal < 100, LDL-P < 1000. Meds: simvastatin 40mg , Zetia 10mg , Niaspan 1000 mg qhs. LDL and LDL-P increased over the past year.   ALT normal. Plan: 1. Change simvastatin to atorvastatin 40mg  daily 2. Check an NMR LipoProfile and hepatic panel in 6 months Please notify patient, and set up labs. thanks.

## 2014-02-22 NOTE — Telephone Encounter (Signed)
Please see Sally's recs

## 2014-03-02 MED ORDER — ATORVASTATIN CALCIUM 40 MG PO TABS: 40.0000 mg | ORAL_TABLET | Freq: Every day | ORAL | Status: DC

## 2014-03-02 NOTE — Telephone Encounter (Signed)
Spoke with pt about stopping Simvastatin and start taking Atorvastain 40mg  daily.  Informed pt that labs (NMR LipoProfile and Hepatic Panel) need to be drawn in 6 months.  Made appt for 08/31/14. Verified pharmacy and informed pt I would send over prescription.  Pt verbalized understanding and agreed to this treatment.

## 2014-03-02 NOTE — Addendum Note (Signed)
Addended by: Loren Racer on: 03/02/2014 04:04 PM   Modules accepted: Orders, Medications

## 2014-08-31 ENCOUNTER — Other Ambulatory Visit: Payer: Medicare HMO

## 2014-11-03 ENCOUNTER — Other Ambulatory Visit: Payer: Self-pay

## 2014-11-03 NOTE — Telephone Encounter (Signed)
Medication Detail      Disp Refills Start End     metoprolol succinate (TOPROL-XL) 25 MG 24 hr tablet 30 tablet 11 11/12/2013     Sig - Route: Take 1 tablet (25 mg total) by mouth daily. - Oral    E-Prescribing Status: Receipt confirmed by pharmacy (11/12/2013 9:44 AM EDT)     Pharmacy    [id number] - [place], [place] - [address] AT [address]

## 2014-12-13 ENCOUNTER — Other Ambulatory Visit: Payer: Self-pay | Admitting: Interventional Cardiology

## 2015-01-10 ENCOUNTER — Other Ambulatory Visit: Payer: Self-pay | Admitting: Interventional Cardiology

## 2015-01-12 ENCOUNTER — Other Ambulatory Visit: Payer: Self-pay

## 2015-01-12 MED ORDER — METOPROLOL SUCCINATE ER 25 MG PO TB24: 25.0000 mg | ORAL_TABLET | Freq: Every day | ORAL | Status: DC

## 2015-01-30 ENCOUNTER — Other Ambulatory Visit: Payer: Self-pay | Admitting: *Deleted

## 2015-01-30 MED ORDER — NIACIN ER (ANTIHYPERLIPIDEMIC) 1000 MG PO TBCR: 1000.0000 mg | EXTENDED_RELEASE_TABLET | Freq: Every day | ORAL | Status: DC

## 2015-03-15 ENCOUNTER — Other Ambulatory Visit: Payer: Self-pay | Admitting: Interventional Cardiology

## 2015-03-27 ENCOUNTER — Encounter: Payer: Self-pay | Admitting: Interventional Cardiology

## 2015-03-27 ENCOUNTER — Ambulatory Visit (INDEPENDENT_AMBULATORY_CARE_PROVIDER_SITE_OTHER): Payer: Medicare HMO | Admitting: Interventional Cardiology

## 2015-03-27 VITALS — BP 140/90 | HR 60 | Ht 70.0 in | Wt 227.0 lb

## 2015-03-27 DIAGNOSIS — E782 Mixed hyperlipidemia: Secondary | ICD-10-CM

## 2015-03-27 DIAGNOSIS — Z9861 Coronary angioplasty status: Secondary | ICD-10-CM

## 2015-03-27 DIAGNOSIS — I10 Essential (primary) hypertension: Secondary | ICD-10-CM

## 2015-03-27 DIAGNOSIS — I2510 Atherosclerotic heart disease of native coronary artery without angina pectoris: Secondary | ICD-10-CM

## 2015-03-27 MED ORDER — NITROGLYCERIN 0.4 MG SL SUBL: 0.4000 mg | SUBLINGUAL_TABLET | SUBLINGUAL | Status: AC | PRN

## 2015-03-27 MED ORDER — LISINOPRIL 10 MG PO TABS: 10.0000 mg | ORAL_TABLET | Freq: Every day | ORAL | Status: DC

## 2015-03-27 MED ORDER — METOPROLOL SUCCINATE ER 25 MG PO TB24: 25.0000 mg | ORAL_TABLET | Freq: Every day | ORAL | Status: DC

## 2015-03-27 MED ORDER — NIACIN ER (ANTIHYPERLIPIDEMIC) 1000 MG PO TBCR: 1000.0000 mg | EXTENDED_RELEASE_TABLET | Freq: Every day | ORAL | Status: DC

## 2015-03-27 MED ORDER — TICAGRELOR 90 MG PO TABS: 90.0000 mg | ORAL_TABLET | Freq: Two times a day (BID) | ORAL | Status: DC

## 2015-03-27 MED ORDER — ATORVASTATIN CALCIUM 40 MG PO TABS: 40.0000 mg | ORAL_TABLET | Freq: Every day | ORAL | Status: DC

## 2015-03-27 MED ORDER — PANTOPRAZOLE SODIUM 40 MG PO TBEC: 40.0000 mg | DELAYED_RELEASE_TABLET | Freq: Every day | ORAL | Status: DC

## 2015-03-27 NOTE — Progress Notes (Signed)
Patient ID: Chad [name], male   DOB: 08-19-1945, 70 y.o.   MRN: [id number]     Cardiology Office Note   Date:  03/27/2015   ID:  Chad [name], DOB February 08, 1946, MRN [id number]  PCP:  Gara Kroner, MD    No chief complaint on file. CAD   Wt Readings from Last 3 Encounters:  03/27/15 227 lb (102.967 kg)  11/12/13 222 lb (100.699 kg)  05/07/13 232 lb 6.4 oz (105.416 kg)       History of Present Illness: Chad [name] is a 70 y.o. male  with known CAD. He had multiple stents to his right coronary artery several years ago. He had an episode of chest discomfort and went to the hospital in 11/14. He ruled out for MI. He had an outpatient stress test which was negative for ischemia. He was started on Imdur then. Since that time, he has done well.   No further chest discomfort.  He walks 4 days a week.  No chest pain or NTG usage.  No SHOB or change in exercise tolerance.  He has been otherwise healthy.  NO sx like his previous CAD.    He does to the beach 3-4 days a week. He works 3 days a week. He has no symptoms blood pressure being elevated into the 150 range typically.    Past Medical History  Diagnosis Date  . Hypertension   . Coronary atherosclerosis of native coronary artery   . Mixed hyperlipidemia     Past Surgical History  Procedure Laterality Date  . Heart stent  x 7  . Tonsillectomy    . Appendectomy       Current Outpatient Prescriptions  Medication Sig Dispense Refill  . aspirin EC 81 MG tablet Take 81 mg by mouth daily.    Marland Kitchen atorvastatin (LIPITOR) 40 MG tablet Take 1 tablet (40 mg total) by mouth daily. 30 tablet 6  . finasteride (PROSCAR) 5 MG tablet Take 5 mg by mouth daily.    . folic acid (FOLVITE) Q000111Q MCG tablet Take 1,600 mcg by mouth daily.    . metoprolol succinate (TOPROL-XL) 25 MG 24 hr tablet Take 1 tablet (25 mg total) by mouth daily. 30 tablet 6  . niacin (NIASPAN) 1000 MG CR tablet Take 1 tablet (1,000 mg total) by mouth at bedtime. 30  tablet 6  . nitroGLYCERIN (NITROSTAT) 0.4 MG SL tablet Place 1 tablet (0.4 mg total) under the tongue every 5 (five) minutes as needed for chest pain. 25 tablet 3  . pantoprazole (PROTONIX) 40 MG tablet Take 1 tablet (40 mg total) by mouth daily. 30 tablet 6  . ticagrelor (BRILINTA) 90 MG TABS tablet Take 1 tablet (90 mg total) by mouth 2 (two) times daily. 60 tablet 6   No current facility-administered medications for this visit.    Allergies:   Penicillins    Social History:  The patient  reports that he has never smoked. He does not have any smokeless tobacco history on file. He reports that he drinks alcohol. He reports that he does not use illicit drugs.   Family History:  The patient's family history includes CAD in his brother, father, and mother; Heart attack in his father and mother. There is no history of Stroke.    ROS:  Please see the history of present illness.   Otherwise, review of systems are positive for elevated blood pressure readings.   All other systems are reviewed and negative.  PHYSICAL EXAM: VS:  BP 140/90 mmHg  Pulse 60  Ht 5\' 10"  (1.778 m)  Wt 227 lb (102.967 kg)  BMI 32.57 kg/m2 , BMI Body mass index is 32.57 kg/(m^2). GEN: Well nourished, well developed, in no acute distress HEENT: normal Neck: no JVD, carotid bruits, or masses Cardiac: RRR; no murmurs, rubs, or gallops,no edema  Respiratory:  clear to auscultation bilaterally, normal work of breathing GI: soft, nontender, nondistended, + BS MS: no deformity or atrophy Skin: warm and dry, no rash Neuro:  Strength and sensation are intact Psych: euthymic mood, full affect   EKG:   The ekg ordered today demonstrates sinus bradycardia, no ST segment changes   Recent Labs: No results found for requested labs within last 365 days.   Lipid Panel    Component Value Date/Time   CHOL 182 02/09/2014 0803   TRIG 177* 02/09/2014 0803   HDL 65 02/09/2014 0803   LDLCALC 82 02/09/2014 0803       Other studies Reviewed: Additional studies/ records that were reviewed today with results demonstrating: .   ASSESSMENT AND PLAN:  1. CAD: No angina.  COntinue DAPT.  He has multiple stents in the RCA, including double layer.  Consider stopping aspirin at his next visit. 2. HTN: blood pressure elevated. Add lisinopril 10 mg daily. Will check electrolytes in a week. He'll check his blood pressure at home. His blood pressures there have been high as well. Encourage regular exercise.   3. Hyperlipidemia:  Continue current lipid-lowering therapy. Lipids well controlled in December 2015. Will recheck cholesterol at this time as well.  Continue atorvastatin.   Current medicines are reviewed at length with the patient today.  The patient concerns regarding his medicines were addressed.  The following changes have been made:  Add lisinopril  Labs/ tests ordered today include: BMet  Orders Placed This Encounter  Procedures  . EKG 12-Lead    Recommend 150 minutes/week of aerobic exercise Low fat, low carb, high fiber diet recommended  Disposition:   FU in 1 year   Teresita Madura., MD  03/27/2015 3:21 PM    [facility] [place], [place], [place]  [address] Phone: [phone]; Fax: [phone]

## 2015-03-27 NOTE — Patient Instructions (Addendum)
**Note De-Identified Inari Shin Obfuscation** Medication Instructions:  Start taking Lisinopril 10 mg daily-All other medications remain the same. Check blood pressures at home.  Labwork: 1 week-CMET and Lipids-Please do not eat or drink after midnight the night before labs are drawn.  Testing/Procedures: None  Follow-Up: Your physician wants you to follow-up in: 1 year. You will receive a reminder letter in the mail two months in advance. If you don't receive a letter, please call our office to schedule the follow-up appointment.      If you need a refill on your cardiac medications before your next appointment, please call your pharmacy.

## 2015-04-03 ENCOUNTER — Other Ambulatory Visit (INDEPENDENT_AMBULATORY_CARE_PROVIDER_SITE_OTHER): Payer: Medicare HMO | Admitting: *Deleted

## 2015-04-03 DIAGNOSIS — E785 Hyperlipidemia, unspecified: Secondary | ICD-10-CM

## 2015-04-03 DIAGNOSIS — I25810 Atherosclerosis of coronary artery bypass graft(s) without angina pectoris: Secondary | ICD-10-CM

## 2015-04-03 LAB — COMPREHENSIVE METABOLIC PANEL
ALBUMIN: 3.9 g/dL (ref 3.6–5.1)
ALK PHOS: 57 U/L (ref 40–115)
ALT: 21 U/L (ref 9–46)
AST: 22 U/L (ref 10–35)
BUN: 14 mg/dL (ref 7–25)
CHLORIDE: 107 mmol/L (ref 98–110)
CO2: 24 mmol/L (ref 20–31)
Calcium: 8.7 mg/dL (ref 8.6–10.3)
Creat: 1.1 mg/dL (ref 0.70–1.25)
Glucose, Bld: 121 mg/dL — ABNORMAL HIGH (ref 65–99)
POTASSIUM: 4.2 mmol/L (ref 3.5–5.3)
Sodium: 141 mmol/L (ref 135–146)
TOTAL PROTEIN: 6.6 g/dL (ref 6.1–8.1)
Total Bilirubin: 0.8 mg/dL (ref 0.2–1.2)

## 2015-04-03 LAB — LIPID PANEL
CHOLESTEROL: 157 mg/dL (ref 125–200)
HDL: 69 mg/dL (ref >=40)
LDL Cholesterol: 62 mg/dL (ref <130)
TRIGLYCERIDES: 131 mg/dL (ref <150)
Total CHOL/HDL Ratio: 2.3 Ratio (ref <=5.0)
VLDL: 26 mg/dL (ref <30)

## 2015-07-04 ENCOUNTER — Telehealth: Payer: Self-pay | Admitting: *Deleted

## 2015-07-04 NOTE — Telephone Encounter (Signed)
At the pts last OV with Dr Irish Lack on 2/13 the pt was advised to start taking Lisinopril 10 mg daily due to HTN (BP was 140/90). The pt c/o dizziness with very little exertion since starting Lisinopril. He states that his BP this morning after very little walking was 98/62. He states that he checks his BP about 3 to 4 X weekly and it has been low and similar to this mornings reading. He could not give any other readings at this time as he is not at home. He reports taking his Lisinopril every morning at 7 am.  He wants to know if he can stop taking Lisinopril. Please advise.

## 2015-07-04 NOTE — Telephone Encounter (Signed)
pt called with issues of significatn drop in blood pressure & dizziness while taking lisinopril, please call him back to discuss this, thanks

## 2015-07-04 NOTE — Telephone Encounter (Signed)
The pt is advised and he verbalized understanding. He states that he will call me back in about 2 weeks with his BP readings. Lisinopril removed from the pts med list.

## 2015-07-04 NOTE — Telephone Encounter (Signed)
Ok to stop lisinopril.  Record BP severeal times a week for the next few weeks and let us know the readings he gets

## 2015-09-25 ENCOUNTER — Telehealth: Payer: Self-pay | Admitting: Interventional Cardiology

## 2015-09-25 NOTE — Telephone Encounter (Signed)
New message     FYI   Pt recorded BP readings and the average reading: 157/91 and high reading 174/99 and low reading is 139/78 that's over the last several weeks. Pulse average was 64, high 75 and low 58.

## 2015-09-25 NOTE — Telephone Encounter (Signed)
The pt is concerned that his BPs.are to high. See BP in this message.  The pt denies headaches, dizziness, SOB or CP and states that he feels fine at this time.  He is advised to check his BP about 2 hours after taking his morning medications, write BPs down and to call me back in about 1 week to report his BP readings.  He verbalized understanding and is in agreement with plan.

## 2015-10-11 ENCOUNTER — Telehealth: Payer: Self-pay | Admitting: Interventional Cardiology

## 2015-10-11 NOTE — Telephone Encounter (Signed)
New message       Pt c/o BP issue: STAT if pt c/o blurred vision, one-sided weakness or slurred speech  1. What are your last 5 BP readings?mid150's over mid 90's  2. Are you having any other symptoms (ex. Dizziness, headache, blurred vision, passed out)? no 3. What is your BP issue? Pt was told to take his bp 2hrs after taking his medication.  He called to give a bp range.

## 2015-10-11 NOTE — Telephone Encounter (Signed)
The pt was advised on 8/14 to check his BP daily about 1 to 2 hours after taking his morning meds because he states that his BP has been elevated.  He called back today with an average of his BPs but not the readings.  He states that he is not at home at this time but that when he gets home he will check to see if his readings are in the memory of his BP machine and call me back with his readings.

## 2015-11-03 ENCOUNTER — Telehealth: Payer: Self-pay

## 2015-11-03 MED ORDER — LISINOPRIL 10 MG PO TABS: 10.0000 mg | ORAL_TABLET | Freq: Every day | ORAL | 3 refills | Status: DC

## 2015-11-03 NOTE — Telephone Encounter (Signed)
Stop metoprolol.  Restart lisinopril 10 mg daily. Metoprolol affects HR. Lisinopril does not.

## 2015-11-03 NOTE — Telephone Encounter (Signed)
Is he taking lisinopril 10 mg daily?  If so, can increase to 20 mg daily and check BMet in a week.  If not, see if there was a reaction when we prescribed the medicine last time.

## 2015-11-03 NOTE — Telephone Encounter (Signed)
Spoke with pt and advised him of recommendations per Dr. Irish Lack.  Advised pt to let us know if he has any issues with restarting Lisinopril.  Pt verbalized understanding and was in agreement with this plan.

## 2015-11-03 NOTE — Telephone Encounter (Signed)
Spoke with pt and he states that he stopped taking his Lisinopril about 2-3 months ago because it was dropping his HR so low that he felt like he was going to pass out.  Advised pt I will send message back to Dr. Irish Lack for review.

## 2015-11-03 NOTE — Telephone Encounter (Signed)
The pt came by office and dropped off his BP readings. 8/26 @ 8:38 am BP=146/87  HR=58 8/26 @ 7:27 pm BP=156/89  HR=60 8/27 @ 8:15 am BP=148/87  HR=64 8/27 @ 8:01 pm BP=152/87  HR=75 8/28 @ 6:31 am BP=158/92  HR=66 8/28 @ 7:14 pm BP=167/94  HR=61 8/29 @ 6:38 am BP=159/96  HR=61 8/29 @ 7:49 pm BP=164/93  HR=74 8/30 @ 6:44 am BP=157/91  HR=62 8/30 @ 10:21 pm BP=160/93  HR=64 8/31 @ 7:56 am BP=166/93  HR=67 8/31 @ 10:31 pm BP=149/85  HR=68 9/1 @ 8:03 am BP=156/77  HR=63 9/1 @ 9:18 pm BP=169/85  HR=64 9/2 @ 10:37 am BP=143/79  HR=60 9/3 @ 8:00 am BP=148/92  HR=63 9/4 @ 8:40 am BP=147/87  HR=56  Please advise.

## 2015-11-23 ENCOUNTER — Other Ambulatory Visit: Payer: Self-pay | Admitting: Interventional Cardiology

## 2016-03-04 ENCOUNTER — Other Ambulatory Visit: Payer: Self-pay | Admitting: *Deleted

## 2016-03-04 MED ORDER — TICAGRELOR 90 MG PO TABS: ORAL_TABLET | ORAL | 0 refills | Status: DC

## 2016-03-04 MED ORDER — ATORVASTATIN CALCIUM 40 MG PO TABS: 40.0000 mg | ORAL_TABLET | Freq: Every day | ORAL | 0 refills | Status: DC

## 2016-03-21 NOTE — Progress Notes (Signed)
Patient ID: Chad [name], male   DOB: 12-26-1945, 71 y.o.   MRN: [id number]     Cardiology Office Note   Date:  03/22/2016   ID:  Chad [name], DOB November 15, 1945, MRN [id number]  PCP:  Gara Kroner, MD    No chief complaint on file. CAD   Wt Readings from Last 3 Encounters:  03/22/16 221 lb 12.8 oz (100.6 kg)  03/27/15 227 lb (103 kg)  11/12/13 222 lb (100.7 kg)       History of Present Illness: Chad [name] is a 71 y.o. male  with known CAD. He had multiple stents to his right coronary artery in 2012. He had an episode of chest discomfort and went to the hospital in 11/14. He ruled out for MI. He had an outpatient stress test which was negative for ischemia. He was started on Imdur then. Since that time, he has done well.   No further chest discomfort.  He walks 4 days a week as [place], [place] the beach.  No chest pain or NTG usage.  No SHOB or change in exercise tolerance.  He has been otherwise healthy.  NO sx like his previous CAD.    He goes to the beach 3-4 days a week. He works 3 days a week here , as an Chief Financial Officer. He has no symptoms blood pressure being elevated into the 130/80 range typically.    He admits to some dietary indiscretion.    No bleeding issues.      Past Medical History:  Diagnosis Date  . Coronary atherosclerosis of native coronary artery   . Hypertension   . Mixed hyperlipidemia     Past Surgical History:  Procedure Laterality Date  . APPENDECTOMY    . heart stent  x 7  . TONSILLECTOMY       Current Outpatient Prescriptions  Medication Sig Dispense Refill  . aspirin EC 81 MG tablet Take 81 mg by mouth daily.    Marland Kitchen atorvastatin (LIPITOR) 40 MG tablet Take 1 tablet (40 mg total) by mouth daily. 90 tablet 0  . finasteride (PROSCAR) 5 MG tablet Take 5 mg by mouth daily.    . folic acid (FOLVITE) Q000111Q MCG tablet Take 1,600 mcg by mouth daily.    Marland Kitchen lisinopril (PRINIVIL,ZESTRIL) 10 MG tablet Take 1 tablet (10 mg total) by mouth daily. 90 tablet 3    . metoprolol succinate (TOPROL-XL) 25 MG 24 hr tablet Take 1 tablet (25 mg total) by mouth daily. 30 tablet 6  . niacin (NIASPAN) 1000 MG CR tablet Take 1 tablet (1,000 mg total) by mouth at bedtime. 30 tablet 6  . nitroGLYCERIN (NITROSTAT) 0.4 MG SL tablet Place 1 tablet (0.4 mg total) under the tongue every 5 (five) minutes as needed for chest pain. 25 tablet 3  . pantoprazole (PROTONIX) 40 MG tablet TAKE 1 TABLET(40 MG) BY MOUTH DAILY 90 tablet 0  . ticagrelor (BRILINTA) 90 MG TABS tablet TAKE 1 TABLET(90 MG) BY MOUTH TWICE DAILY. 180 tablet 0   No current facility-administered medications for this visit.     Allergies:   Penicillins    Social History:  The patient  reports that he has never smoked. He has never used smokeless tobacco. He reports that he drinks alcohol. He reports that he does not use drugs.   Family History:  The patient's family history includes CAD in his brother, father, and mother; Heart attack in his father and mother.    ROS:  Please see  the history of present illness.   Otherwise, review of systems are positive for elevated blood pressure readings.   All other systems are reviewed and negative.    PHYSICAL EXAM: VS:  BP 122/80 (BP Location: Right Arm, Patient Position: Sitting, Cuff Size: Normal)   Pulse 79   Ht 5\' 10"  (1.778 m)   Wt 221 lb 12.8 oz (100.6 kg)   BMI 31.82 kg/m  , BMI Body mass index is 31.82 kg/m. GEN: Well nourished, well developed, in no acute distress  HEENT: normal  Neck: no JVD, carotid bruits, or masses Cardiac: RRR; no murmurs, rubs, or gallops,no edema  Respiratory:  clear to auscultation bilaterally, normal work of breathing GI: soft, nontender, nondistended, + BS MS: no deformity or atrophy  Skin: warm and dry, no rash Neuro:  Strength and sensation are intact Psych: euthymic mood, full affect   EKG:   The ekg ordered today demonstrates sinus bradycardia, no ST segment changes   Recent Labs: 04/03/2015: ALT 21; BUN  14; Creat 1.10; Potassium 4.2; Sodium 141   Lipid Panel    Component Value Date/Time   CHOL 157 04/03/2015 0742   CHOL 182 02/09/2014 0803   TRIG 131 04/03/2015 0742   TRIG 177 (H) 02/09/2014 0803   HDL 69 04/03/2015 0742   HDL 65 02/09/2014 0803   CHOLHDL 2.3 04/03/2015 0742   VLDL 26 04/03/2015 0742   LDLCALC 62 04/03/2015 0742   LDLCALC 82 02/09/2014 0803     Other studies Reviewed: Additional studies/ records that were reviewed today with results demonstrating: .   ASSESSMENT AND PLAN:  1. CAD: No angina.  COntinue DAPT.  He has multiple stents in the RCA, including double layer.  Stop aspirin.  No bleeding but it has been a few years since his stents. 2. Hypertensive heart diseaes: Controlled on lisinopril 10 mg daily.   Encourage regular exercise.  Minimize salt.  3. Hyperlipidemia:  Continue current lipid-lowering therapy. Lipids well controlled in Feb 2017. Will recheck cholesterol at this time as well.  Continue atorvastatin.   Current medicines are reviewed at length with the patient today.  The patient concerns regarding his medicines were addressed.  The following changes have been made:    Labs/ tests ordered today include: CMet and lipids fasting  No orders of the defined types were placed in this encounter.   Recommend 150 minutes/week of aerobic exercise Low fat, low carb, high fiber diet recommended  Disposition:   FU in 1 year   Signed, Larae Grooms, MD  03/22/2016 [facility] [place], [place],   [address] Phone: [phone]; Fax: [phone]

## 2016-03-22 ENCOUNTER — Encounter: Payer: Self-pay | Admitting: Interventional Cardiology

## 2016-03-22 ENCOUNTER — Ambulatory Visit (INDEPENDENT_AMBULATORY_CARE_PROVIDER_SITE_OTHER): Payer: Medicare HMO | Admitting: Interventional Cardiology

## 2016-03-22 ENCOUNTER — Encounter (INDEPENDENT_AMBULATORY_CARE_PROVIDER_SITE_OTHER): Payer: Self-pay

## 2016-03-22 VITALS — BP 122/80 | HR 79 | Ht 70.0 in | Wt 221.8 lb

## 2016-03-22 DIAGNOSIS — I119 Hypertensive heart disease without heart failure: Secondary | ICD-10-CM | POA: Insufficient documentation

## 2016-03-22 DIAGNOSIS — I2510 Atherosclerotic heart disease of native coronary artery without angina pectoris: Secondary | ICD-10-CM

## 2016-03-22 DIAGNOSIS — E782 Mixed hyperlipidemia: Secondary | ICD-10-CM

## 2016-03-22 DIAGNOSIS — Z9861 Coronary angioplasty status: Secondary | ICD-10-CM | POA: Diagnosis not present

## 2016-03-22 NOTE — Patient Instructions (Signed)
Medication Instructions:  Same-no changes  Labwork: CMET and Lipids on 2/12. Please do not eat or drink after midnight the night before labs are drawn.  Testing/Procedures: None  Follow-Up: Your physician wants you to follow-up in: 1 year. You will receive a reminder letter in the mail two months in advance. If you don't receive a letter, please call our office to schedule the follow-up appointment.     If you need a refill on your cardiac medications before your next appointment, please call your pharmacy.

## 2016-03-25 ENCOUNTER — Other Ambulatory Visit: Payer: Medicare HMO | Admitting: *Deleted

## 2016-03-25 DIAGNOSIS — E782 Mixed hyperlipidemia: Secondary | ICD-10-CM

## 2016-03-25 LAB — COMPREHENSIVE METABOLIC PANEL
ALK PHOS: 59 IU/L (ref 39–117)
ALT: 17 IU/L (ref 0–44)
AST: 20 IU/L (ref 0–40)
Albumin/Globulin Ratio: 2.1 (ref 1.2–2.2)
Albumin: 4.4 g/dL (ref 3.6–4.8)
BUN / CREAT RATIO: 17 (ref 10–24)
BUN: 25 mg/dL (ref 8–27)
Bilirubin Total: 0.5 mg/dL (ref 0.0–1.2)
CALCIUM: 8.5 mg/dL — AB (ref 8.6–10.2)
CO2: 21 mmol/L (ref 18–29)
CREATININE: 1.46 mg/dL — AB (ref 0.76–1.27)
Chloride: 103 mmol/L (ref 96–106)
GFR calc Af Amer: 56 mL/min/{1.73_m2} — ABNORMAL LOW (ref >59)
GFR calc non Af Amer: 48 mL/min/{1.73_m2} — ABNORMAL LOW (ref >59)
GLUCOSE: 116 mg/dL — AB (ref 65–99)
Globulin, Total: 2.1 g/dL (ref 1.5–4.5)
Potassium: 4.7 mmol/L (ref 3.5–5.2)
Sodium: 143 mmol/L (ref 134–144)
Total Protein: 6.5 g/dL (ref 6.0–8.5)

## 2016-03-25 LAB — LIPID PANEL
CHOLESTEROL TOTAL: 142 mg/dL (ref 100–199)
Chol/HDL Ratio: 2.4 ratio units (ref 0.0–5.0)
HDL: 59 mg/dL (ref >39)
LDL CALC: 55 mg/dL (ref 0–99)
TRIGLYCERIDES: 138 mg/dL (ref 0–149)
VLDL CHOLESTEROL CAL: 28 mg/dL (ref 5–40)

## 2016-03-26 ENCOUNTER — Telehealth: Payer: Self-pay | Admitting: *Deleted

## 2016-03-26 DIAGNOSIS — R7989 Other specified abnormal findings of blood chemistry: Secondary | ICD-10-CM

## 2016-03-26 DIAGNOSIS — R69 Illness, unspecified: Secondary | ICD-10-CM | POA: Diagnosis not present

## 2016-03-26 NOTE — Telephone Encounter (Signed)
-----   Message from Jettie Booze, MD sent at 03/25/2016  5:10 PM EST ----- Creatinine is somewhat increased.  Drink plenty of water and repeat BMet in one week.  Lipids, liver well controlled.

## 2016-03-26 NOTE — Telephone Encounter (Signed)
Informed pt of lab results. Pt verbalized understanding. Pt agreeable to come for labs on 04/01/16.

## 2016-03-30 ENCOUNTER — Other Ambulatory Visit: Payer: Self-pay | Admitting: Interventional Cardiology

## 2016-04-01 ENCOUNTER — Other Ambulatory Visit: Payer: Medicare HMO | Admitting: *Deleted

## 2016-04-01 DIAGNOSIS — I2510 Atherosclerotic heart disease of native coronary artery without angina pectoris: Secondary | ICD-10-CM | POA: Diagnosis not present

## 2016-04-01 DIAGNOSIS — I10 Essential (primary) hypertension: Secondary | ICD-10-CM

## 2016-04-01 DIAGNOSIS — I110 Hypertensive heart disease with heart failure: Secondary | ICD-10-CM

## 2016-04-01 LAB — BASIC METABOLIC PANEL
BUN / CREAT RATIO: 15 (ref 10–24)
BUN: 21 mg/dL (ref 8–27)
CHLORIDE: 101 mmol/L (ref 96–106)
CO2: 20 mmol/L (ref 18–29)
Calcium: 8.3 mg/dL — ABNORMAL LOW (ref 8.6–10.2)
Creatinine, Ser: 1.43 mg/dL — ABNORMAL HIGH (ref 0.76–1.27)
GFR calc non Af Amer: 50 mL/min/{1.73_m2} — ABNORMAL LOW (ref >59)
GFR, EST AFRICAN AMERICAN: 57 mL/min/{1.73_m2} — AB (ref >59)
GLUCOSE: 127 mg/dL — AB (ref 65–99)
POTASSIUM: 4.8 mmol/L (ref 3.5–5.2)
Sodium: 138 mmol/L (ref 134–144)

## 2016-04-04 ENCOUNTER — Other Ambulatory Visit: Payer: Self-pay

## 2016-04-04 DIAGNOSIS — I10 Essential (primary) hypertension: Secondary | ICD-10-CM

## 2016-05-31 ENCOUNTER — Other Ambulatory Visit: Payer: Self-pay | Admitting: Interventional Cardiology

## 2016-06-07 DIAGNOSIS — Z955 Presence of coronary angioplasty implant and graft: Secondary | ICD-10-CM | POA: Diagnosis not present

## 2016-06-07 DIAGNOSIS — K219 Gastro-esophageal reflux disease without esophagitis: Secondary | ICD-10-CM | POA: Diagnosis not present

## 2016-06-07 DIAGNOSIS — I10 Essential (primary) hypertension: Secondary | ICD-10-CM | POA: Diagnosis not present

## 2016-06-07 DIAGNOSIS — R39198 Other difficulties with micturition: Secondary | ICD-10-CM | POA: Diagnosis not present

## 2016-06-07 DIAGNOSIS — N401 Enlarged prostate with lower urinary tract symptoms: Secondary | ICD-10-CM | POA: Diagnosis not present

## 2016-06-07 DIAGNOSIS — H9110 Presbycusis, unspecified ear: Secondary | ICD-10-CM | POA: Diagnosis not present

## 2016-06-07 DIAGNOSIS — Z79899 Other long term (current) drug therapy: Secondary | ICD-10-CM | POA: Diagnosis not present

## 2016-06-07 DIAGNOSIS — I259 Chronic ischemic heart disease, unspecified: Secondary | ICD-10-CM | POA: Diagnosis not present

## 2016-06-07 DIAGNOSIS — Z6832 Body mass index (BMI) 32.0-32.9, adult: Secondary | ICD-10-CM | POA: Diagnosis not present

## 2016-06-07 DIAGNOSIS — E785 Hyperlipidemia, unspecified: Secondary | ICD-10-CM | POA: Diagnosis not present

## 2016-06-07 DIAGNOSIS — E669 Obesity, unspecified: Secondary | ICD-10-CM | POA: Diagnosis not present

## 2016-06-07 DIAGNOSIS — Z0000 Encounter for general adult medical examination without abnormal findings: Secondary | ICD-10-CM | POA: Diagnosis not present

## 2016-06-18 ENCOUNTER — Other Ambulatory Visit: Payer: Self-pay | Admitting: Interventional Cardiology

## 2016-06-18 DIAGNOSIS — R351 Nocturia: Secondary | ICD-10-CM | POA: Diagnosis not present

## 2016-06-18 DIAGNOSIS — N401 Enlarged prostate with lower urinary tract symptoms: Secondary | ICD-10-CM | POA: Diagnosis not present

## 2016-06-18 DIAGNOSIS — C673 Malignant neoplasm of anterior wall of bladder: Secondary | ICD-10-CM | POA: Diagnosis not present

## 2016-09-17 ENCOUNTER — Telehealth: Payer: Self-pay

## 2016-09-17 NOTE — Telephone Encounter (Signed)
Request for clearance:  1. What type of procedure is being performed? Colonoscopy   2. When is this procedure scheduled? 10/21/16   3. Are there any medications that need to be held prior to procedure and how long? Brilinta 3 days prior to procedure   4. Name of physician performing procedure? Dr. Amedeo Plenty   5. What is your office phone and fax number? Office- [phone], Fax- [phone]

## 2016-09-18 NOTE — Telephone Encounter (Signed)
OK to hold Brilinta 3 days prior to test.  No further cardiac testing needed.

## 2016-09-19 NOTE — Telephone Encounter (Signed)
Clearance faxed. Confirmation received that fax was successful.

## 2016-09-24 DIAGNOSIS — R69 Illness, unspecified: Secondary | ICD-10-CM | POA: Diagnosis not present

## 2016-09-30 ENCOUNTER — Encounter (INDEPENDENT_AMBULATORY_CARE_PROVIDER_SITE_OTHER): Payer: Self-pay

## 2016-09-30 ENCOUNTER — Other Ambulatory Visit: Payer: Medicare HMO | Admitting: *Deleted

## 2016-09-30 ENCOUNTER — Other Ambulatory Visit: Payer: Self-pay

## 2016-09-30 DIAGNOSIS — I10 Essential (primary) hypertension: Secondary | ICD-10-CM | POA: Diagnosis not present

## 2016-09-30 LAB — BASIC METABOLIC PANEL
BUN/Creatinine Ratio: 18 (ref 10–24)
BUN: 31 mg/dL — AB (ref 8–27)
CALCIUM: 8.7 mg/dL (ref 8.6–10.2)
CO2: 19 mmol/L — AB (ref 20–29)
CREATININE: 1.71 mg/dL — AB (ref 0.76–1.27)
Chloride: 105 mmol/L (ref 96–106)
GFR calc Af Amer: 46 mL/min/{1.73_m2} — ABNORMAL LOW (ref >59)
GFR, EST NON AFRICAN AMERICAN: 40 mL/min/{1.73_m2} — AB (ref >59)
Glucose: 109 mg/dL — ABNORMAL HIGH (ref 65–99)
Potassium: 5.5 mmol/L — ABNORMAL HIGH (ref 3.5–5.2)
Sodium: 139 mmol/L (ref 134–144)

## 2016-10-07 ENCOUNTER — Other Ambulatory Visit: Payer: Medicare HMO | Admitting: *Deleted

## 2016-10-07 DIAGNOSIS — I10 Essential (primary) hypertension: Secondary | ICD-10-CM | POA: Diagnosis not present

## 2016-10-07 LAB — BASIC METABOLIC PANEL
BUN / CREAT RATIO: 18 (ref 10–24)
BUN: 26 mg/dL (ref 8–27)
CO2: 18 mmol/L — ABNORMAL LOW (ref 20–29)
CREATININE: 1.44 mg/dL — AB (ref 0.76–1.27)
Calcium: 8.7 mg/dL (ref 8.6–10.2)
Chloride: 106 mmol/L (ref 96–106)
GFR calc non Af Amer: 49 mL/min/{1.73_m2} — ABNORMAL LOW (ref >59)
GFR, EST AFRICAN AMERICAN: 56 mL/min/{1.73_m2} — AB (ref >59)
GLUCOSE: 103 mg/dL — AB (ref 65–99)
Potassium: 4.8 mmol/L (ref 3.5–5.2)
SODIUM: 141 mmol/L (ref 134–144)

## 2016-10-22 DIAGNOSIS — R39198 Other difficulties with micturition: Secondary | ICD-10-CM | POA: Diagnosis not present

## 2016-10-22 DIAGNOSIS — Z8551 Personal history of malignant neoplasm of bladder: Secondary | ICD-10-CM | POA: Diagnosis not present

## 2016-10-22 DIAGNOSIS — R3129 Other microscopic hematuria: Secondary | ICD-10-CM | POA: Diagnosis not present

## 2016-10-22 DIAGNOSIS — M545 Low back pain: Secondary | ICD-10-CM | POA: Diagnosis not present

## 2016-10-22 DIAGNOSIS — Z23 Encounter for immunization: Secondary | ICD-10-CM | POA: Diagnosis not present

## 2016-11-25 ENCOUNTER — Other Ambulatory Visit: Payer: Self-pay | Admitting: Interventional Cardiology

## 2016-11-27 DIAGNOSIS — Z86010 Personal history of colonic polyps: Secondary | ICD-10-CM | POA: Diagnosis not present

## 2016-12-19 DIAGNOSIS — C673 Malignant neoplasm of anterior wall of bladder: Secondary | ICD-10-CM | POA: Diagnosis not present

## 2017-01-08 DIAGNOSIS — C673 Malignant neoplasm of anterior wall of bladder: Secondary | ICD-10-CM | POA: Diagnosis not present

## 2017-01-08 DIAGNOSIS — N200 Calculus of kidney: Secondary | ICD-10-CM | POA: Diagnosis not present

## 2017-02-23 ENCOUNTER — Other Ambulatory Visit: Payer: Self-pay | Admitting: Interventional Cardiology

## 2017-02-26 ENCOUNTER — Encounter: Payer: Self-pay | Admitting: *Deleted

## 2017-02-26 ENCOUNTER — Other Ambulatory Visit: Payer: Self-pay

## 2017-02-26 ENCOUNTER — Other Ambulatory Visit: Payer: Self-pay | Admitting: *Deleted

## 2017-02-26 ENCOUNTER — Encounter: Payer: Self-pay | Admitting: Surgery

## 2017-02-26 ENCOUNTER — Ambulatory Visit: Payer: Medicare HMO | Admitting: Surgery

## 2017-02-26 VITALS — BP 138/88 | HR 57 | Temp 97.3°F | Resp 16 | Ht 70.0 in | Wt 217.0 lb

## 2017-02-26 DIAGNOSIS — I701 Atherosclerosis of renal artery: Secondary | ICD-10-CM

## 2017-02-26 NOTE — H&P (View-Only) (Signed)
[facility] [place]  Patient name: Chad [name] MRN: [id number] DOB: Oct 31, 1945 Sex: male   REQUESTING PROVIDER:    Dr. Lovena Neighbours    REASON FOR CONSULT:    Renal artery stenosis  HISTORY OF PRESENT ILLNESS:   Chad [name] is a 72 y.o. male, who is referred today for evaluation of renal artery stenosis.  He has a history of renal insufficiency.  He recently underwent CT scan for follow-up of a bladder cancer and was found to have left renal atrophy indicative of compromised renal function.  There was a concern of her renal artery stenosis as a potential source.  He is a non-smoker Patient has a history of multiple stents to his heart in 2012.  He suffers from hypertension which is managed with an ACE inhibitor.  He takes a statin for hypercholesterolemia.  He is on  Brilinta  PAST MEDICAL HISTORY    Past Medical History:  Diagnosis Date  . Coronary atherosclerosis of native coronary artery   . Hypertension   . Mixed hyperlipidemia      FAMILY HISTORY   Family History  Problem Relation Age of Onset  . CAD Mother   . CAD Father   . CAD Brother   . Heart attack Mother   . Heart attack Father   . Stroke Neg Hx     SOCIAL HISTORY:   Social History   Socioeconomic History  . Marital status: Married    Spouse name: Not on file  . Number of children: Not on file  . Years of education: Not on file  . Highest education level: Not on file  Social Needs  . Financial resource strain: Not on file  . Food insecurity - worry: Not on file  . Food insecurity - inability: Not on file  . Transportation needs - medical: Not on file  . Transportation needs - non-medical: Not on file  Occupational History  . Not on file  Tobacco Use  . Smoking status: Never Smoker  . Smokeless tobacco: Never Used  Substance and Sexual Activity  . Alcohol use: Yes    Comment: social  . Drug use: No  . Sexual activity: Not on file   Other Topics Concern  . Not on file  Social History Narrative  . Not on file    ALLERGIES:    Allergies  Allergen Reactions  . Penicillins     CURRENT MEDICATIONS:    Current Outpatient Medications  Medication Sig Dispense Refill  . aspirin EC 81 MG tablet Take 81 mg by mouth daily.    Marland Kitchen atorvastatin (LIPITOR) 40 MG tablet Take 1 tablet (40 mg total) by mouth daily. 90 tablet 0  . atorvastatin (LIPITOR) 40 MG tablet TAKE 1 TABLET (40 MG TOTAL) BY MOUTH DAILY. 90 tablet 2  . BRILINTA 90 MG TABS tablet TAKE 1 TABLET(90 MG) BY MOUTH TWICE DAILY. 180 tablet 3  . finasteride (PROSCAR) 5 MG tablet Take 5 mg by mouth daily.    . folic acid (FOLVITE) 409 MCG tablet Take 1,600 mcg by mouth daily.    Marland Kitchen lisinopril (PRINIVIL,ZESTRIL) 10 MG tablet Take 1 tablet (10 mg total) by mouth daily. Patient needs to call and schedule an appointment for further refills 1st attempt 30 tablet 0  . metoprolol succinate (TOPROL-XL) 25 MG 24 hr tablet Take 1 tablet (25 mg total) by mouth daily. 30 tablet 6  . niacin (NIASPAN) 1000 MG CR tablet TAKE 1 TABLET (1000MG ) BY  MOUTH AT BEDTIME 30 tablet 11  . nitroGLYCERIN (NITROSTAT) 0.4 MG SL tablet Place 1 tablet (0.4 mg total) under the tongue every 5 (five) minutes as needed for chest pain. 25 tablet 3  . pantoprazole (PROTONIX) 40 MG tablet TAKE 1 TABLET(40 MG) BY MOUTH DAILY 90 tablet 0   No current facility-administered medications for this visit.     REVIEW OF SYSTEMS:   [X]  denotes positive finding, [ ]  denotes negative finding Cardiac  Comments:  Chest pain or chest pressure:    Shortness of breath upon exertion:    Short of breath when lying flat:    Irregular heart rhythm:        Vascular    Pain in calf, thigh, or hip brought on by ambulation:    Pain in feet at night that wakes you up from your sleep:     Blood clot in your veins:    Leg swelling:         Pulmonary    Oxygen at home:    Productive cough:     Wheezing:           Neurologic    Sudden weakness in arms or legs:     Sudden numbness in arms or legs:     Sudden onset of difficulty speaking or slurred speech:    Temporary loss of vision in one eye:     Problems with dizziness:         Gastrointestinal    Blood in stool:      Vomited blood:         Genitourinary    Burning when urinating:     Blood in urine:        Psychiatric    Major depression:         Hematologic    Bleeding problems:    Problems with blood clotting too easily:        Skin    Rashes or ulcers:        Constitutional    Fever or chills:     PHYSICAL EXAM:   There were no vitals filed for this visit.  GENERAL: The patient is a well-nourished male, in no acute distress. The vital signs are documented above. CARDIAC: There is a regular rate and rhythm.  VASCULAR: No carotid bruits.  Palpable pedal pulses bilaterally. PULMONARY: Nonlabored respirations ABDOMEN: Soft and non-tender with normal pitched bowel sounds.  MUSCULOSKELETAL: There are no major deformities or cyanosis. NEUROLOGIC: No focal weakness or paresthesias are detected. SKIN: There are no ulcers or rashes noted. PSYCHIATRIC: The patient has a normal affect.  STUDIES:   I have reviewed his CT scan which shows discrepancy in the size between his left and right kidney.  He has aortic calcification  ASSESSMENT and PLAN   We discussed potentially proceeding with angiography to better define the anatomy of his left renal artery.  If I found a stenosis we could potentially consider stenting this to possibly improve his renal function.  I did discuss with him that given the decrease in size of his kidney that recovery of function may not be possible.  He would like to do everything possible to try to save what little function he may have remaining in his left kidney.  I have scheduled his procedure for Tuesday, January 22.  I told him to hydrate himself prior to the procedure given his known chronic renal  insufficiency.  I will do the aortic portion of the study  with CO2.   Annamarie Major, MD [facility] [phone] Pager [phone]

## 2017-02-26 NOTE — Progress Notes (Signed)
[facility] [place]  Patient name: Chad [name] MRN: [id number] DOB: [birth year] Sex: male   REQUESTING PROVIDER:    Dr. Lovena Neighbours    REASON FOR CONSULT:    Renal artery stenosis  HISTORY OF PRESENT ILLNESS:   Chad [name] is a 72 y.o. male, who is referred today for evaluation of renal artery stenosis.  He has a history of renal insufficiency.  He recently underwent CT scan for follow-up of a bladder cancer and was found to have left renal atrophy indicative of compromised renal function.  There was a concern of her renal artery stenosis as a potential source.  He is a non-smoker Patient has a history of multiple stents to his heart in 2012.  He suffers from hypertension which is managed with an ACE inhibitor.  He takes a statin for hypercholesterolemia.  He is on  Brilinta  PAST MEDICAL HISTORY    Past Medical History:  Diagnosis Date  . Coronary atherosclerosis of native coronary artery   . Hypertension   . Mixed hyperlipidemia      FAMILY HISTORY   Family History  Problem Relation Age of Onset  . CAD Mother   . CAD Father   . CAD Brother   . Heart attack Mother   . Heart attack Father   . Stroke Neg Hx     SOCIAL HISTORY:   Social History   Socioeconomic History  . Marital status: Married    Spouse name: Not on file  . Number of children: Not on file  . Years of education: Not on file  . Highest education level: Not on file  Social Needs  . Financial resource strain: Not on file  . Food insecurity - worry: Not on file  . Food insecurity - inability: Not on file  . Transportation needs - medical: Not on file  . Transportation needs - non-medical: Not on file  Occupational History  . Not on file  Tobacco Use  . Smoking status: Never Smoker  . Smokeless tobacco: Never Used  Substance and Sexual Activity  . Alcohol use: Yes    Comment: social  . Drug use: No  . Sexual activity: Not on file   Other Topics Concern  . Not on file  Social History Narrative  . Not on file    ALLERGIES:    Allergies  Allergen Reactions  . Penicillins     CURRENT MEDICATIONS:    Current Outpatient Medications  Medication Sig Dispense Refill  . aspirin EC 81 MG tablet Take 81 mg by mouth daily.    Marland Kitchen atorvastatin (LIPITOR) 40 MG tablet Take 1 tablet (40 mg total) by mouth daily. 90 tablet 0  . atorvastatin (LIPITOR) 40 MG tablet TAKE 1 TABLET (40 MG TOTAL) BY MOUTH DAILY. 90 tablet 2  . BRILINTA 90 MG TABS tablet TAKE 1 TABLET(90 MG) BY MOUTH TWICE DAILY. 180 tablet 3  . finasteride (PROSCAR) 5 MG tablet Take 5 mg by mouth daily.    . folic acid (FOLVITE) 539 MCG tablet Take 1,600 mcg by mouth daily.    Marland Kitchen lisinopril (PRINIVIL,ZESTRIL) 10 MG tablet Take 1 tablet (10 mg total) by mouth daily. Patient needs to call and schedule an appointment for further refills 1st attempt 30 tablet 0  . metoprolol succinate (TOPROL-XL) 25 MG 24 hr tablet Take 1 tablet (25 mg total) by mouth daily. 30 tablet 6  . niacin (NIASPAN) 1000 MG CR tablet TAKE 1 TABLET (1000MG ) BY  MOUTH AT BEDTIME 30 tablet 11  . nitroGLYCERIN (NITROSTAT) 0.4 MG SL tablet Place 1 tablet (0.4 mg total) under the tongue every 5 (five) minutes as needed for chest pain. 25 tablet 3  . pantoprazole (PROTONIX) 40 MG tablet TAKE 1 TABLET(40 MG) BY MOUTH DAILY 90 tablet 0   No current facility-administered medications for this visit.     REVIEW OF SYSTEMS:   [X]  denotes positive finding, [ ]  denotes negative finding Cardiac  Comments:  Chest pain or chest pressure:    Shortness of breath upon exertion:    Short of breath when lying flat:    Irregular heart rhythm:        Vascular    Pain in calf, thigh, or hip brought on by ambulation:    Pain in feet at night that wakes you up from your sleep:     Blood clot in your veins:    Leg swelling:         Pulmonary    Oxygen at home:    Productive cough:     Wheezing:           Neurologic    Sudden weakness in arms or legs:     Sudden numbness in arms or legs:     Sudden onset of difficulty speaking or slurred speech:    Temporary loss of vision in one eye:     Problems with dizziness:         Gastrointestinal    Blood in stool:      Vomited blood:         Genitourinary    Burning when urinating:     Blood in urine:        Psychiatric    Major depression:         Hematologic    Bleeding problems:    Problems with blood clotting too easily:        Skin    Rashes or ulcers:        Constitutional    Fever or chills:     PHYSICAL EXAM:   There were no vitals filed for this visit.  GENERAL: The patient is a well-nourished male, in no acute distress. The vital signs are documented above. CARDIAC: There is a regular rate and rhythm.  VASCULAR: No carotid bruits.  Palpable pedal pulses bilaterally. PULMONARY: Nonlabored respirations ABDOMEN: Soft and non-tender with normal pitched bowel sounds.  MUSCULOSKELETAL: There are no major deformities or cyanosis. NEUROLOGIC: No focal weakness or paresthesias are detected. SKIN: There are no ulcers or rashes noted. PSYCHIATRIC: The patient has a normal affect.  STUDIES:   I have reviewed his CT scan which shows discrepancy in the size between his left and right kidney.  He has aortic calcification  ASSESSMENT and PLAN   We discussed potentially proceeding with angiography to better define the anatomy of his left renal artery.  If I found a stenosis we could potentially consider stenting this to possibly improve his renal function.  I did discuss with him that given the decrease in size of his kidney that recovery of function may not be possible.  He would like to do everything possible to try to save what little function he may have remaining in his left kidney.  I have scheduled his procedure for Tuesday, January 22.  I told him to hydrate himself prior to the procedure given his known chronic renal  insufficiency.  I will do the aortic portion of the study  with CO2.   Annamarie Major, MD [facility] [phone] Pager [phone]

## 2017-02-27 ENCOUNTER — Encounter: Payer: Self-pay | Admitting: Urology

## 2017-03-03 ENCOUNTER — Encounter: Payer: Medicare HMO | Admitting: Surgery

## 2017-03-04 ENCOUNTER — Encounter (HOSPITAL_COMMUNITY): Payer: Self-pay | Admitting: *Deleted

## 2017-03-04 ENCOUNTER — Encounter (HOSPITAL_COMMUNITY)
Admission: RE | Disposition: A | Discharge status: Home / Self Care | Payer: Self-pay | Source: Ambulatory Visit | Attending: Surgery

## 2017-03-04 ENCOUNTER — Ambulatory Visit (HOSPITAL_COMMUNITY)
Admission: RE | Admit: 2017-03-04 | Discharge: 2017-03-04 | Disposition: A | Discharge status: Home / Self Care | Payer: Medicare HMO | Source: Ambulatory Visit | Attending: Surgery | Admitting: Surgery

## 2017-03-04 DIAGNOSIS — Z79899 Other long term (current) drug therapy: Secondary | ICD-10-CM | POA: Diagnosis not present

## 2017-03-04 DIAGNOSIS — I129 Hypertensive chronic kidney disease with stage 1 through stage 4 chronic kidney disease, or unspecified chronic kidney disease: Secondary | ICD-10-CM | POA: Insufficient documentation

## 2017-03-04 DIAGNOSIS — Z7982 Long term (current) use of aspirin: Secondary | ICD-10-CM | POA: Diagnosis not present

## 2017-03-04 DIAGNOSIS — E782 Mixed hyperlipidemia: Secondary | ICD-10-CM | POA: Diagnosis not present

## 2017-03-04 DIAGNOSIS — I10 Essential (primary) hypertension: Secondary | ICD-10-CM | POA: Diagnosis not present

## 2017-03-04 DIAGNOSIS — Z8249 Family history of ischemic heart disease and other diseases of the circulatory system: Secondary | ICD-10-CM | POA: Insufficient documentation

## 2017-03-04 DIAGNOSIS — I2510 Atherosclerotic heart disease of native coronary artery without angina pectoris: Secondary | ICD-10-CM | POA: Diagnosis not present

## 2017-03-04 DIAGNOSIS — I701 Atherosclerosis of renal artery: Secondary | ICD-10-CM | POA: Diagnosis not present

## 2017-03-04 DIAGNOSIS — Z880 Allergy status to penicillin: Secondary | ICD-10-CM | POA: Diagnosis not present

## 2017-03-04 HISTORY — PX: RENAL ANGIOGRAPHY: CATH118260

## 2017-03-04 HISTORY — PX: RENAL ARTERY STENT: SHX2321

## 2017-03-04 LAB — POCT I-STAT, CHEM 8
BUN: 26 mg/dL — AB (ref 6–20)
CHLORIDE: 106 mmol/L (ref 101–111)
CREATININE: 1.6 mg/dL — AB (ref 0.61–1.24)
Calcium, Ion: 1.07 mmol/L — ABNORMAL LOW (ref 1.15–1.40)
GLUCOSE: 104 mg/dL — AB (ref 65–99)
HCT: 36 % — ABNORMAL LOW (ref 39.0–52.0)
Hemoglobin: 12.2 g/dL — ABNORMAL LOW (ref 13.0–17.0)
POTASSIUM: 4.4 mmol/L (ref 3.5–5.1)
Sodium: 140 mmol/L (ref 135–145)
TCO2: 23 mmol/L (ref 22–32)

## 2017-03-04 SURGERY — RENAL ANGIOGRAPHY
Anesthesia: LOCAL

## 2017-03-04 MED ORDER — HEPARIN (PORCINE) IN NACL 2-0.9 UNIT/ML-% IJ SOLN
INTRAMUSCULAR | Status: AC
  Filled 2017-03-04: qty 1000

## 2017-03-04 MED ORDER — FENTANYL CITRATE (PF) 100 MCG/2ML IJ SOLN
INTRAMUSCULAR | Status: AC
  Filled 2017-03-04: qty 2

## 2017-03-04 MED ORDER — IODIXANOL 320 MG/ML IV SOLN
INTRAVENOUS | Status: DC | PRN
  Administered 2017-03-04: 20 mL via INTRA_ARTERIAL

## 2017-03-04 MED ORDER — SODIUM CHLORIDE 0.9 % IV SOLN: 250.0000 mL | INTRAVENOUS | Status: DC | PRN

## 2017-03-04 MED ORDER — LABETALOL HCL 5 MG/ML IV SOLN: 10.0000 mg | INTRAVENOUS | Status: DC | PRN

## 2017-03-04 MED ORDER — SODIUM CHLORIDE 0.9 % IV SOLN: INTRAVENOUS | Status: AC

## 2017-03-04 MED ORDER — CLOPIDOGREL BISULFATE 75 MG PO TABS: 75.0000 mg | ORAL_TABLET | Freq: Every day | ORAL | 11 refills | Status: DC

## 2017-03-04 MED ORDER — MIDAZOLAM HCL 2 MG/2ML IJ SOLN
INTRAMUSCULAR | Status: DC | PRN
  Administered 2017-03-04 (×3): 1 mg via INTRAVENOUS

## 2017-03-04 MED ORDER — SODIUM CHLORIDE 0.9% FLUSH: 3.0000 mL | Freq: Two times a day (BID) | INTRAVENOUS | Status: DC

## 2017-03-04 MED ORDER — MORPHINE SULFATE (PF) 10 MG/ML IV SOLN: 1.0000 mg | INTRAVENOUS | Status: DC | PRN

## 2017-03-04 MED ORDER — HEPARIN SODIUM (PORCINE) 1000 UNIT/ML IJ SOLN
INTRAMUSCULAR | Status: DC | PRN
  Administered 2017-03-04: 9000 [IU] via INTRAVENOUS

## 2017-03-04 MED ORDER — OXYCODONE HCL 5 MG PO TABS: 5.0000 mg | ORAL_TABLET | ORAL | Status: DC | PRN

## 2017-03-04 MED ORDER — HYDRALAZINE HCL 20 MG/ML IJ SOLN: 5.0000 mg | INTRAMUSCULAR | Status: DC | PRN

## 2017-03-04 MED ORDER — CLOPIDOGREL BISULFATE 75 MG PO TABS: 75.0000 mg | ORAL_TABLET | Freq: Every day | ORAL | Status: DC

## 2017-03-04 MED ORDER — ASPIRIN EC 81 MG PO TBEC: 81.0000 mg | DELAYED_RELEASE_TABLET | Freq: Every day | ORAL | Status: DC

## 2017-03-04 MED ORDER — SODIUM CHLORIDE 0.9 % IV SOLN
INTRAVENOUS | Status: DC
  Administered 2017-03-04: 08:00:00 via INTRAVENOUS

## 2017-03-04 MED ORDER — SODIUM CHLORIDE 0.9% FLUSH: 3.0000 mL | INTRAVENOUS | Status: DC | PRN

## 2017-03-04 MED ORDER — HEPARIN (PORCINE) IN NACL 2-0.9 UNIT/ML-% IJ SOLN
INTRAMUSCULAR | Status: AC | PRN
  Administered 2017-03-04: 1000 mL

## 2017-03-04 MED ORDER — MIDAZOLAM HCL 2 MG/2ML IJ SOLN
INTRAMUSCULAR | Status: AC
  Filled 2017-03-04: qty 2

## 2017-03-04 MED ORDER — LIDOCAINE HCL 1 % IJ SOLN
INTRAMUSCULAR | Status: AC
  Filled 2017-03-04: qty 20

## 2017-03-04 MED ORDER — LIDOCAINE HCL (PF) 1 % IJ SOLN
INTRAMUSCULAR | Status: DC | PRN
  Administered 2017-03-04: 15 mL

## 2017-03-04 MED ORDER — HEPARIN SODIUM (PORCINE) 1000 UNIT/ML IJ SOLN
INTRAMUSCULAR | Status: AC
  Filled 2017-03-04: qty 1

## 2017-03-04 MED ORDER — FENTANYL CITRATE (PF) 100 MCG/2ML IJ SOLN
INTRAMUSCULAR | Status: DC | PRN
  Administered 2017-03-04 (×3): 25 ug via INTRAVENOUS

## 2017-03-04 SURGICAL SUPPLY — 22 items
CATH OMNI FLUSH 5F 65CM (CATHETERS) ×1 IMPLANT
COVER PRB 48X5XTLSCP FOLD TPE (BAG) IMPLANT
COVER PROBE 5X48 (BAG) ×2
DEVICE CLOSURE MYNXGRIP 6/7F (Vascular Products) ×1 IMPLANT
DRAPE ZERO GRAVITY STERILE (DRAPES) ×1 IMPLANT
FILTER CO2 0.2 MICRON (VASCULAR PRODUCTS) ×1 IMPLANT
GUIDE CATH VISTA IMA 6F (CATHETERS) ×1 IMPLANT
GUIDE CATH VISTA RDC 6F (CATHETERS) ×1 IMPLANT
KIT ENCORE 26 ADVANTAGE (KITS) ×1 IMPLANT
KIT MICROINTRODUCER STIFF 5F (SHEATH) ×1 IMPLANT
KIT PV (KITS) ×2 IMPLANT
RESERVOIR CO2 (VASCULAR PRODUCTS) ×1 IMPLANT
SET FLUSH CO2 (MISCELLANEOUS) ×1 IMPLANT
SHEATH PINNACLE 6F 10CM (SHEATH) ×1 IMPLANT
STENT HERCULINK RX 6.0X12X135 (Permanent Stent) ×1 IMPLANT
STOPCOCK MORSE 400PSI 3WAY (MISCELLANEOUS) ×1 IMPLANT
SYR MEDRAD MARK V 150ML (SYRINGE) ×2 IMPLANT
TRANSDUCER W/STOPCOCK (MISCELLANEOUS) ×2 IMPLANT
TRAY PV CATH (CUSTOM PROCEDURE TRAY) ×2 IMPLANT
TUBING CIL FLEX 10 FLL-RA (TUBING) ×1 IMPLANT
WIRE BENTSON .035X145CM (WIRE) ×1 IMPLANT
WIRE STABILIZER XS .014X180CM (WIRE) ×1 IMPLANT

## 2017-03-04 NOTE — Progress Notes (Signed)
Up and walked and tolerated well; right groin stable, no bleeding or hematoma 

## 2017-03-04 NOTE — Op Note (Signed)
    Patient name: Chad [name] MRN: [id number] DOB: 07-26-1945 Sex: male  03/04/2017 Pre-operative Diagnosis: left renal artery stenosis Post-operative diagnosis:  Same Surgeon:  Annamarie Major Procedure Performed:  1.  Ultrasound-guided access, right femoral artery  2.  Abdominal aortogram with CO2  3.  Stent, left renal artery  4.  Conscious sedation (64 minutes)  5.  Closure device (mynx)    Indications: The patient had a CT scan which showed decreased size and function of the left kidney with renal artery stenosis.  The patient has hypertension and worsening renal insufficiency.  We discussed further evaluation and possible stent placement if the lesion was felt to be hemostatic.  The patient understands that this has the possibility but does not guarantee improvement of blood pressure control and renal insufficiency.  Procedure:  The patient was identified in the holding area and taken to room 8.  The patient was then placed supine on the table and prepped and draped in the usual sterile fashion.  A time out was called.  Conscious sedation was administered with the use of IV fentanyl and Versed under continuous physician and nurse monitoring.  Heart rate, blood pressure, and oxygen saturation were continuously monitored.  Ultrasound was used to evaluate the right common femoral artery.  It was patent .  A digital ultrasound image was acquired.  A micropuncture needle was used to access the right common femoral artery under ultrasound guidance.  An 018 wire was advanced without resistance and a micropuncture sheath was placed.  The 018 wire was removed and a benson wire was placed.  The micropuncture sheath was exchanged for a 6 french sheath.  An omniflush catheter was advanced over the wire to the level of L-1.  An abdominal angiogram was obtained with CO2. Findings:   Aortogram: The super mesenteric artery and right renal artery appears patent.  There is a high-grade 90% lesion at the  origin of the left renal artery.  The infrarenal abdominal aorta and visualized portions of the iliac arteries remain widely patent.    Intervention:   After the above images were acquired the decision was made to proceed with intervention.  The patient was fully heparinized.  I first used a IM guide catheter to try and cannulate the left renal artery stenosis excess and therefore switched out to a [facility] guide catheter.  I was able to cannulate the left renal artery with the catheter and stabilizer wire.  I selected a 6 x 12 Herculink balloon expandable stent and deployed this into the left renal artery extending slightly into the aorta.  I did try to flare the inferior portion of the stent with a second balloon inflation.  Completion imaging showed an excellent result with no residual stenosis.  Catheters and wires were removed.  The patient was taken to the holding area for sheath pull once the coag elation profile corrects.  Impression:  #1  Successful placement of a 6 x 12 balloon expandable left renal artery stent 4 890% lesion with no residual stenosis   V. Annamarie Major, M.D. [facility] [place] Office: [phone] Pager:  [phone]

## 2017-03-04 NOTE — Discharge Instructions (Signed)

## 2017-03-04 NOTE — Interval H&P Note (Signed)
History and Physical Interval Note:  03/04/2017 9:30 PM  Chad [name]  has presented today for surgery, with the diagnosis of renal artery stenosis  The various methods of treatment have been discussed with the patient and family. After consideration of risks, benefits and other options for treatment, the patient has consented to  Procedure(s): RENAL ANGIOGRAPHY (N/A) as a surgical intervention .  The patient's history has been reviewed, patient examined, no change in status, stable for surgery.  I have reviewed the patient's chart and labs.  Questions were answered to the patient's satisfaction.     Annamarie Major

## 2017-03-05 ENCOUNTER — Encounter (HOSPITAL_COMMUNITY): Payer: Self-pay | Admitting: Surgery

## 2017-03-05 MED FILL — Lidocaine HCl Local Inj 1%: INTRAMUSCULAR | Qty: 20 | Status: AC

## 2017-03-16 ENCOUNTER — Other Ambulatory Visit: Payer: Self-pay | Admitting: Interventional Cardiology

## 2017-03-17 ENCOUNTER — Telehealth: Payer: Self-pay | Admitting: Surgery

## 2017-03-17 NOTE — Telephone Encounter (Signed)
Sched lab 04/04/17 at 8:00 and MD at 04/07/17 at 1:30. Spoke to pt to inform them of appt.

## 2017-03-17 NOTE — Telephone Encounter (Signed)
-----   Message from Serafina Mitchell, MD sent at 03/17/2017  4:07 PM EST ----- Regarding: RE: stent follow up Yes 1 month with renal duplex.,  Thanks ----- Message ----- From: Georgiann Mccoy Sent: 03/13/2017  11:51 AM To: Serafina Mitchell, MD Subject: stent follow up                                This pt is s/p abd aortogram with L renal artery stent on 03/04/17.  Does this pt need a follow up in the office?

## 2017-03-18 ENCOUNTER — Other Ambulatory Visit: Payer: Self-pay

## 2017-03-18 DIAGNOSIS — I722 Aneurysm of renal artery: Secondary | ICD-10-CM

## 2017-03-18 DIAGNOSIS — Z48812 Encounter for surgical aftercare following surgery on the circulatory system: Secondary | ICD-10-CM

## 2017-03-26 ENCOUNTER — Other Ambulatory Visit: Payer: Self-pay | Admitting: Interventional Cardiology

## 2017-03-26 DIAGNOSIS — R69 Illness, unspecified: Secondary | ICD-10-CM | POA: Diagnosis not present

## 2017-03-26 NOTE — Progress Notes (Signed)
Cardiology Office Note   Date:  03/27/2017   ID:  Chad KLUENDER, DOB December 30, 1945, MRN [id number]  PCP:  Antony Contras, MD    No chief complaint on file. CAD   Wt Readings from Last 3 Encounters:  03/27/17 223 lb (101.2 kg)  03/04/17 216 lb (98 kg)  02/26/17 217 lb (98.4 kg)       History of Present Illness: Chad [name] is a 72 y.o. male  with known CAD. He had multiple stents to his right coronary artery in 2012. He had an episode of chest discomfort and went to the hospital in 11/14. He ruled out for MI. He had an outpatient stress test which was negative for ischemia. He was started on Imdur then. Since that time, he has done well.   He no longer goes to the beach 3-4 days a week. He works 3 days a week here , as an Chief Financial Officer.  He joined a gym and does some light cardio.Marland KitchenMarland KitchenSilver Sneakers.  He had some renal insufficiency and was found to have renal artery stenosis.  A renal stent was placed in Jan 2019.  He has done well.   He had some bruising at the right femoral site, using the Mynx device.  Denies : Chest pain. Dizziness. Leg edema. Nitroglycerin use. Orthopnea. Palpitations. Paroxysmal nocturnal dyspnea. Shortness of breath. Syncope.   No bleeding problems.   No bruising.      Past Medical History:  Diagnosis Date  . Coronary atherosclerosis of native coronary artery   . Hypertension   . Mixed hyperlipidemia     Past Surgical History:  Procedure Laterality Date  . APPENDECTOMY    . heart stent  x 7  . RENAL ANGIOGRAPHY N/A 03/04/2017   Procedure: RENAL ANGIOGRAPHY;  Surgeon: Serafina Mitchell, MD;  Location: [place] [facility];  Service: Cardiovascular;  Laterality: N/A;  . TONSILLECTOMY       Current Outpatient Medications  Medication Sig Dispense Refill  . aspirin EC 81 MG tablet Take 81 mg by mouth daily.    Marland Kitchen atorvastatin (LIPITOR) 40 MG tablet Take 1 tablet (40 mg total) by mouth daily. Please make yearly appt with Dr. Irish Lack for February.  1st attempt 30 tablet 0  . BRILINTA 90 MG TABS tablet TAKE 1 TABLET(90 MG) BY MOUTH TWICE DAILY. 180 tablet 3  . finasteride (PROSCAR) 5 MG tablet Take 5 mg by mouth daily.    . folic acid (FOLVITE) 462 MCG tablet Take 800 mcg by mouth daily.     Marland Kitchen lisinopril (PRINIVIL,ZESTRIL) 10 MG tablet Take 1 tablet (10 mg total) by mouth daily. Please schedule yearly appt for anymore refills! 2nd attmpt, [phone] 15 tablet 0  . niacin 500 MG tablet Take 500 mg by mouth at bedtime.    . nitroGLYCERIN (NITROSTAT) 0.4 MG SL tablet Place 1 tablet (0.4 mg total) under the tongue every 5 (five) minutes as needed for chest pain. 25 tablet 3  . pantoprazole (PROTONIX) 40 MG tablet TAKE 1 TABLET(40 MG) BY MOUTH DAILY 90 tablet 0   No current facility-administered medications for this visit.     Allergies:   Penicillins    Social History:  The patient  reports that  has never smoked. he has never used smokeless tobacco. He reports that he drinks alcohol. He reports that he does not use drugs.   Family History:  The patient's family history includes CAD in his brother, father, and mother; Heart attack in his  father and mother.    ROS:  Please see the history of present illness.   Otherwise, review of systems are positive for recent kidney issues.   All other systems are reviewed and negative.    PHYSICAL EXAM: VS:  BP 110/72   Pulse 63   Ht 5\' 10"  (1.778 m)   Wt 223 lb (101.2 kg)   SpO2 95%   BMI 32.00 kg/m  , BMI Body mass index is 32 kg/m. GEN: Well nourished, well developed, in no acute distress  HEENT: normal  Neck: no JVD, carotid bruits, or masses Cardiac: RRR; no murmurs, rubs, or gallops,no edema  Respiratory:  clear to auscultation bilaterally, normal work of breathing GI: soft, nontender, nondistended, + BS MS: no deformity or atrophy  Skin: warm and dry, no rash Neuro:  Strength and sensation are intact Psych: euthymic mood, full affect   EKG:   The ekg ordered today  demonstrates Normal sinus rhythm   Recent Labs: 03/04/2017: BUN 26; Creatinine, Ser 1.60; Hemoglobin 12.2; Potassium 4.4; Sodium 140   Lipid Panel    Component Value Date/Time   CHOL 142 03/25/2016 0753   CHOL 182 02/09/2014 0803   TRIG 138 03/25/2016 0753   TRIG 177 (H) 02/09/2014 0803   HDL 59 03/25/2016 0753   HDL 65 02/09/2014 0803   CHOLHDL 2.4 03/25/2016 0753   CHOLHDL 2.3 04/03/2015 0742   VLDL 26 04/03/2015 0742   LDLCALC 55 03/25/2016 0753   LDLCALC 82 02/09/2014 0803     Other studies Reviewed: Additional studies/ records that were reviewed today with results demonstrating: Hospital records reviewed.   ASSESSMENT AND PLAN:  1. CAD: No angina.  Continue aggressive secondary prevention. 2. Hypertensive heart disease: Blood pressure controlled.  Continue current medicines. 3. Hyperlipidemia: LDL well controlled in 2018.  Will recheck today. 4. Chronic renal insufficiency, stage 3: Recent renal stent.  We will also check electrolytes to see if renal function is improved post renal artery revascularization.    Current medicines are reviewed at length with the patient today.  The patient concerns regarding his medicines were addressed.  The following changes have been made:  No change  Labs/ tests ordered today include:  No orders of the defined types were placed in this encounter.   Recommend 150 minutes/week of aerobic exercise Low fat, low carb, high fiber diet recommended  Disposition:   FU in 1 year   Signed, Larae Grooms, MD  03/27/2017 10:01 AM    [facility] [address], [place], [place]  [address] Phone: [phone]; Fax: [phone]

## 2017-03-27 ENCOUNTER — Telehealth: Payer: Self-pay

## 2017-03-27 ENCOUNTER — Ambulatory Visit: Payer: Medicare HMO | Admitting: Interventional Cardiology

## 2017-03-27 ENCOUNTER — Encounter: Payer: Self-pay | Admitting: Interventional Cardiology

## 2017-03-27 VITALS — BP 110/72 | HR 63 | Ht 70.0 in | Wt 223.0 lb

## 2017-03-27 DIAGNOSIS — I119 Hypertensive heart disease without heart failure: Secondary | ICD-10-CM

## 2017-03-27 DIAGNOSIS — I25119 Atherosclerotic heart disease of native coronary artery with unspecified angina pectoris: Secondary | ICD-10-CM | POA: Diagnosis not present

## 2017-03-27 DIAGNOSIS — E785 Hyperlipidemia, unspecified: Secondary | ICD-10-CM

## 2017-03-27 DIAGNOSIS — N183 Chronic kidney disease, stage 3 unspecified: Secondary | ICD-10-CM

## 2017-03-27 DIAGNOSIS — Z9861 Coronary angioplasty status: Secondary | ICD-10-CM

## 2017-03-27 DIAGNOSIS — I2510 Atherosclerotic heart disease of native coronary artery without angina pectoris: Secondary | ICD-10-CM

## 2017-03-27 DIAGNOSIS — E782 Mixed hyperlipidemia: Secondary | ICD-10-CM

## 2017-03-27 LAB — COMPREHENSIVE METABOLIC PANEL
ALK PHOS: 80 IU/L (ref 39–117)
ALT: 21 IU/L (ref 0–44)
AST: 17 IU/L (ref 0–40)
Albumin/Globulin Ratio: 1.7 (ref 1.2–2.2)
Albumin: 4.4 g/dL (ref 3.5–4.8)
BUN / CREAT RATIO: 17 (ref 10–24)
BUN: 26 mg/dL (ref 8–27)
Bilirubin Total: 0.6 mg/dL (ref 0.0–1.2)
CO2: 21 mmol/L (ref 20–29)
CREATININE: 1.53 mg/dL — AB (ref 0.76–1.27)
Calcium: 8.9 mg/dL (ref 8.6–10.2)
Chloride: 104 mmol/L (ref 96–106)
GFR calc Af Amer: 52 mL/min/{1.73_m2} — ABNORMAL LOW (ref >59)
GFR, EST NON AFRICAN AMERICAN: 45 mL/min/{1.73_m2} — AB (ref >59)
GLUCOSE: 115 mg/dL — AB (ref 65–99)
Globulin, Total: 2.6 g/dL (ref 1.5–4.5)
Potassium: 5 mmol/L (ref 3.5–5.2)
SODIUM: 141 mmol/L (ref 134–144)
TOTAL PROTEIN: 7 g/dL (ref 6.0–8.5)

## 2017-03-27 LAB — LIPID PANEL
CHOL/HDL RATIO: 3.5 ratio (ref 0.0–5.0)
Cholesterol, Total: 188 mg/dL (ref 100–199)
HDL: 53 mg/dL (ref >39)
LDL CALC: 99 mg/dL (ref 0–99)
Triglycerides: 181 mg/dL — ABNORMAL HIGH (ref 0–149)
VLDL Cholesterol Cal: 36 mg/dL (ref 5–40)

## 2017-03-27 NOTE — Patient Instructions (Signed)
Medication Instructions:  Your physician recommends that you continue on your current medications as directed. Please refer to the Current Medication list given to you today.   Labwork: TODAY: CMET, LIPIDS  Testing/Procedures: None ordered  Follow-Up: Your physician wants you to follow-up in: 1 year with Dr. Varanasi. You will receive a reminder letter in the mail two months in advance. If you don't receive a letter, please call our office to schedule the follow-up appointment.   Any Other Special Instructions Will Be Listed Below (If Applicable).     If you need a refill on your cardiac medications before your next appointment, please call your pharmacy.   

## 2017-03-27 NOTE — Telephone Encounter (Signed)
-----   Message from Jettie Booze, MD sent at 03/27/2017  5:07 PM EST ----- LDL and TG increased.  Cr. About the same.  Try to eat healthier and exercise. Recheck lipids and liver in 6 months

## 2017-03-27 NOTE — Telephone Encounter (Signed)
Called and made patient aware of lab results and recommendations to eat healthier and exercise. Fasting LIPIDS and LFTS ordered and appointment made for 09/24/17. Patient verbalized understanding and thanked me for the call.

## 2017-04-01 DIAGNOSIS — R69 Illness, unspecified: Secondary | ICD-10-CM | POA: Diagnosis not present

## 2017-04-04 ENCOUNTER — Ambulatory Visit (HOSPITAL_COMMUNITY)
Admission: RE | Admit: 2017-04-04 | Discharge: 2017-04-04 | Disposition: A | Discharge status: Home / Self Care | Payer: Medicare HMO | Source: Ambulatory Visit | Attending: Surgery | Admitting: Surgery

## 2017-04-04 DIAGNOSIS — I722 Aneurysm of renal artery: Secondary | ICD-10-CM

## 2017-04-04 DIAGNOSIS — Z48812 Encounter for surgical aftercare following surgery on the circulatory system: Secondary | ICD-10-CM | POA: Diagnosis not present

## 2017-04-07 ENCOUNTER — Other Ambulatory Visit: Payer: Self-pay | Admitting: Interventional Cardiology

## 2017-04-07 ENCOUNTER — Encounter: Payer: Self-pay | Admitting: Surgery

## 2017-04-07 ENCOUNTER — Ambulatory Visit: Payer: Medicare HMO | Admitting: Surgery

## 2017-04-07 ENCOUNTER — Other Ambulatory Visit: Payer: Self-pay

## 2017-04-07 VITALS — BP 111/74 | HR 58 | Temp 97.1°F | Resp 16 | Ht 70.0 in | Wt 215.0 lb

## 2017-04-07 DIAGNOSIS — I701 Atherosclerosis of renal artery: Secondary | ICD-10-CM | POA: Diagnosis not present

## 2017-04-07 NOTE — Progress Notes (Signed)
[facility]  Patient name: Chad [name] MRN: [id number] DOB: December 20, 1945 Sex: male   REASON FOR VISIT:    Follow up  HISOTRY OF PRESENT ILLNESS:    Chad [name] is a 72 y.o. male, who is referred today for evaluation of renal artery stenosis.  He has a history of renal insufficiency.  He recently underwent CT scan for follow-up of a bladder cancer and was found to have left renal atrophy indicative of compromised renal function.  There was a concern of her renal artery stenosis as a potential source.  On 03/04/2017 the patient underwent angiography and was found to have a 90% left renal artery stenosis which was successfully treated with a 6 mm balloon expandable stent.  He reports no complications from his procedure.  He has not noticed any significant change in his blood pressure.  He did have some bruising around his access site  Patient has a history of multiple stents to his heart in 2012.  He suffers from hypertension which is managed with an ACE inhibitor.  He takes a statin for hypercholesterolemia.  He is on  Brilinta  PAST MEDICAL HISTORY:   Past Medical History:  Diagnosis Date  . Coronary atherosclerosis of native coronary artery   . Hypertension   . Mixed hyperlipidemia      FAMILY HISTORY:   Family History  Problem Relation Age of Onset  . CAD Mother   . Heart attack Mother   . CAD Father   . Heart attack Father   . CAD Brother   . Stroke Neg Hx     SOCIAL HISTORY:   Social History   Tobacco Use  . Smoking status: Never Smoker  . Smokeless tobacco: Never Used  Substance Use Topics  . Alcohol use: Yes    Comment: social     ALLERGIES:   Allergies  Allergen Reactions  . Penicillins Rash and Other (See Comments)    Has patient had a PCN reaction causing immediate rash, facial/tongue/throat swelling, SOB or lightheadedness with hypotension: No Has patient had a PCN reaction  causing severe rash involving mucus membranes or skin necrosis: No Has patient had a PCN reaction that required hospitalization: No Has patient had a PCN reaction occurring within the last 10 years: No If all of the above answers are "NO", then may proceed with Cephalosporin use.      CURRENT MEDICATIONS:   Current Outpatient Medications  Medication Sig Dispense Refill  . aspirin EC 81 MG tablet Take 81 mg by mouth daily.    Marland Kitchen atorvastatin (LIPITOR) 40 MG tablet Take 1 tablet (40 mg total) by mouth daily. Please make yearly appt with Dr. Irish Lack for February. 1st attempt 30 tablet 0  . BRILINTA 90 MG TABS tablet TAKE 1 TABLET(90 MG) BY MOUTH TWICE DAILY. 180 tablet 3  . finasteride (PROSCAR) 5 MG tablet Take 5 mg by mouth daily.    . folic acid (FOLVITE) 160 MCG tablet Take 800 mcg by mouth daily.     Marland Kitchen lisinopril (PRINIVIL,ZESTRIL) 10 MG tablet Take 1 tablet (10 mg total) by mouth daily. Please schedule yearly appt for anymore refills! 2nd attmpt, [phone] 15 tablet 0  . niacin 500 MG tablet Take 500 mg by mouth at bedtime.    . nitroGLYCERIN (NITROSTAT) 0.4 MG SL tablet Place 1 tablet (0.4 mg total) under the tongue every 5 (five) minutes as needed for chest pain. 25 tablet 3  . pantoprazole (PROTONIX) 40  MG tablet TAKE 1 TABLET(40 MG) BY MOUTH DAILY 90 tablet 0   No current facility-administered medications for this visit.     REVIEW OF SYSTEMS:   [X]  denotes positive finding, [ ]  denotes negative finding Cardiac  Comments:  Chest pain or chest pressure:    Shortness of breath upon exertion:    Short of breath when lying flat:    Irregular heart rhythm:        Vascular    Pain in calf, thigh, or hip brought on by ambulation:    Pain in feet at night that wakes you up from your sleep:     Blood clot in your veins:    Leg swelling:         Pulmonary    Oxygen at home:    Productive cough:     Wheezing:         Neurologic    Sudden weakness in arms or legs:       Sudden numbness in arms or legs:     Sudden onset of difficulty speaking or slurred speech:    Temporary loss of vision in one eye:     Problems with dizziness:         Gastrointestinal    Blood in stool:     Vomited blood:         Genitourinary    Burning when urinating:     Blood in urine:        Psychiatric    Major depression:         Hematologic    Bleeding problems:    Problems with blood clotting too easily:        Skin    Rashes or ulcers:        Constitutional    Fever or chills:      PHYSICAL EXAM:   Vitals:   04/07/17 1315  BP: 111/74  Pulse: (!) 58  Resp: 16  Temp: (!) 97.1 F (36.2 C)  SpO2: 97%  Weight: 215 lb (97.5 kg)  Height: 5\' 10"  (1.778 m)    GENERAL: The patient is a well-nourished male, in no acute distress. The vital signs are documented above. CARDIAC: There is a regular rate and rhythm.  VASCULAR: Right groin is soft with residual ecchymosis. PULMONARY: Non-labored respirations MUSCULOSKELETAL: There are no major deformities or cyanosis. NEUROLOGIC: No focal weakness or paresthesias are detected. SKIN: There are no ulcers or rashes noted. PSYCHIATRIC: The patient has a normal affect.  STUDIES:   I have ordered and reviewed his vascular ultrasound.  This shows no evidence of renal artery stenosis on the left or right  MEDICAL ISSUES:   Status post stenting of a high-grade stenosis within a left renal artery.  Prior to his procedure, the patient had a CT scan which showed evidence of left kidney atrophy which is felt to be secondary to the renal artery stenosis.  Therefore we elected to proceed with stenting to see if this could slow the progression down of his functional loss.  The patient will follow-up with [place] in 1 year for repeat ultrasound.    Annamarie Major, MD [facility] [phone] Pager [phone]

## 2017-04-20 ENCOUNTER — Other Ambulatory Visit: Payer: Self-pay | Admitting: Interventional Cardiology

## 2017-05-24 ENCOUNTER — Other Ambulatory Visit: Payer: Self-pay | Admitting: Interventional Cardiology

## 2017-09-24 ENCOUNTER — Other Ambulatory Visit: Payer: Medicare HMO | Admitting: *Deleted

## 2017-09-24 DIAGNOSIS — E785 Hyperlipidemia, unspecified: Secondary | ICD-10-CM

## 2017-09-24 LAB — LIPID PANEL
CHOL/HDL RATIO: 3.2 ratio (ref 0.0–5.0)
Cholesterol, Total: 168 mg/dL (ref 100–199)
HDL: 52 mg/dL (ref >39)
LDL Calculated: 75 mg/dL (ref 0–99)
Triglycerides: 206 mg/dL — ABNORMAL HIGH (ref 0–149)
VLDL CHOLESTEROL CAL: 41 mg/dL — AB (ref 5–40)

## 2017-09-24 LAB — HEPATIC FUNCTION PANEL
ALBUMIN: 4.2 g/dL (ref 3.5–4.8)
ALT: 15 IU/L (ref 0–44)
AST: 15 IU/L (ref 0–40)
Alkaline Phosphatase: 70 IU/L (ref 39–117)
BILIRUBIN TOTAL: 0.9 mg/dL (ref 0.0–1.2)
Bilirubin, Direct: 0.24 mg/dL (ref 0.00–0.40)
Total Protein: 6.5 g/dL (ref 6.0–8.5)

## 2017-10-07 DIAGNOSIS — R69 Illness, unspecified: Secondary | ICD-10-CM | POA: Diagnosis not present

## 2017-10-30 DIAGNOSIS — R69 Illness, unspecified: Secondary | ICD-10-CM | POA: Diagnosis not present

## 2017-12-16 DIAGNOSIS — I701 Atherosclerosis of renal artery: Secondary | ICD-10-CM | POA: Diagnosis not present

## 2017-12-16 DIAGNOSIS — Z8551 Personal history of malignant neoplasm of bladder: Secondary | ICD-10-CM | POA: Diagnosis not present

## 2017-12-16 DIAGNOSIS — I10 Essential (primary) hypertension: Secondary | ICD-10-CM | POA: Diagnosis not present

## 2017-12-16 DIAGNOSIS — K219 Gastro-esophageal reflux disease without esophagitis: Secondary | ICD-10-CM | POA: Diagnosis not present

## 2017-12-16 DIAGNOSIS — Z23 Encounter for immunization: Secondary | ICD-10-CM | POA: Diagnosis not present

## 2017-12-16 DIAGNOSIS — I2510 Atherosclerotic heart disease of native coronary artery without angina pectoris: Secondary | ICD-10-CM | POA: Diagnosis not present

## 2017-12-16 DIAGNOSIS — E7800 Pure hypercholesterolemia, unspecified: Secondary | ICD-10-CM | POA: Diagnosis not present

## 2018-01-15 DIAGNOSIS — N471 Phimosis: Secondary | ICD-10-CM | POA: Diagnosis not present

## 2018-01-15 DIAGNOSIS — N261 Atrophy of kidney (terminal): Secondary | ICD-10-CM | POA: Diagnosis not present

## 2018-01-15 DIAGNOSIS — Z8551 Personal history of malignant neoplasm of bladder: Secondary | ICD-10-CM | POA: Diagnosis not present

## 2018-02-20 ENCOUNTER — Other Ambulatory Visit: Payer: Self-pay | Admitting: Urology

## 2018-03-05 ENCOUNTER — Encounter (HOSPITAL_BASED_OUTPATIENT_CLINIC_OR_DEPARTMENT_OTHER): Payer: Self-pay

## 2018-03-06 ENCOUNTER — Telehealth: Payer: Self-pay | Admitting: Interventional Cardiology

## 2018-03-06 NOTE — Telephone Encounter (Signed)
New Message   (Urgent)      [facility] Pre-operative Risk Assessment    Request for surgical clearance:  1. What type of surgery is being performed? Circumcision   2. When is this surgery scheduled? 03/13/18  3. What type of clearance is required (medical clearance vs. Pharmacy clearance to hold med vs. Both)? Both  4. Are there any medications that need to be held prior to surgery and how long? Brilinta for 5 days prior   5. Practice name and name of physician performing surgery? [facility]   6. What is your office phone number [phone] ext. [phone]   7.   What is your office fax number [phone]  8.   Anesthesia type (None, local, MAC, general) ? General    Chad [name] 03/06/2018, 11:30 AM  _________________________________________________________________   (provider comments below)

## 2018-03-06 NOTE — Telephone Encounter (Signed)
   [place], MD  Chart reviewed as part of pre-operative protocol coverage. Because of Chad [name]'s past medical history and time since last visit, he will require a follow-up visit in order to better assess preoperative cardiovascular risk.  Per prior recommendations by Dr. Irish Lack, patient may hold brilinta 3 days prior to upcoming procedure.   Pre-op covering staff: - Please schedule appointment and call patient to inform them. - Please contact requesting surgeon's office via preferred method (i.e, phone, fax) to inform them of need for appointment prior to surgery.   Abigail Butts, PA-C  03/06/2018, 3:46 PM

## 2018-03-06 NOTE — Telephone Encounter (Signed)
Received call from [facility] requesting the status of the patients clearance from procedure. Advised them that I would contact the patient and schedule the patient an appt for Monday.   Contacted patient and he was agreeable with appt for Monday with Jory Sims.

## 2018-03-08 NOTE — Progress Notes (Signed)
Cardiology Office Note   Date:  03/09/2018   ID:  Chad [name], DOB 1945/08/01, MRN [id number]  PCP:  Antony Contras, MD  Cardiologist:  Dr. Irish Lack  Chief Complaint  Patient presents with  . Pre-op Exam     History of Present Illness: Chad [name] is a 73 y.o. male who presents for pre-operative evaluation for circumcision. This is planned for 03/13/2018, with Dr. Gilford Rile with [facility]. He has a known history of CAD with multiple stents to his right coronary artery. He was started on Imdur. He has a history of renal insufficiency and found to have renal artery stenosis s/p stent placement in 02/2017. He is involved in [employer]. He is medically compliant and without any cardiac complaints.   Past Medical History:  Diagnosis Date  . Aortic atherosclerosis ([place])   . Bladder cancer ([place]) 2011  . Coronary atherosclerosis of native coronary artery   . Diverticulosis   . History of hiatal hernia    Small  . History of kidney stones    8mm nonobstructive calculus in the interpolar, left kidney  . Hypertension   . Left hydrocele   . Left renal artery stenosis ([place])   . Mixed hyperlipidemia   . Phimosis   . Renal insufficiency     Past Surgical History:  Procedure Laterality Date  . APPENDECTOMY    . heart stent  x 7  . RENAL ANGIOGRAPHY N/A 03/04/2017   Procedure: RENAL ANGIOGRAPHY;  Surgeon: Serafina Mitchell, MD;  Location: [facility];  Service: Cardiovascular;  Laterality: N/A;  . RENAL ARTERY STENT  03/04/2017  . TONSILLECTOMY    . TRANSURETHRAL RESECTION OF BLADDER TUMOR       Current Outpatient Medications  Medication Sig Dispense Refill  . aspirin EC 81 MG tablet Take 81 mg by mouth daily.    Marland Kitchen atorvastatin (LIPITOR) 40 MG tablet Take 1 tablet (40 mg total) by mouth daily. 90 tablet 3  . BRILINTA 90 MG TABS tablet TAKE 1 TABLET(90 MG) BY MOUTH TWICE DAILY. 180 tablet 3  . finasteride (PROSCAR) 5 MG tablet Take 5 mg by mouth daily.    . folic  acid (FOLVITE) 030 MCG tablet Take 800 mcg by mouth daily.     Marland Kitchen lisinopril (PRINIVIL,ZESTRIL) 10 MG tablet Take 1 tablet (10 mg total) by mouth daily. 90 tablet 3  . niacin 500 MG tablet Take 500 mg by mouth at bedtime.    . nitroGLYCERIN (NITROSTAT) 0.4 MG SL tablet Place 1 tablet (0.4 mg total) under the tongue every 5 (five) minutes as needed for chest pain. 25 tablet 3  . pantoprazole (PROTONIX) 40 MG tablet TAKE 1 TABLET(40 MG) BY MOUTH DAILY 90 tablet 0   No current facility-administered medications for this visit.     Allergies:   Penicillins    Social History:  The patient  reports that he has never smoked. He has never used smokeless tobacco. He reports current alcohol use. He reports that he does not use drugs.   Family History:  The patient's family history includes CAD in his brother, father, and mother; Heart attack in his father and mother.    ROS: All other systems are reviewed and negative. Unless otherwise mentioned in H&P    PHYSICAL EXAM: VS:  BP 121/75 (BP Location: Left Arm, Patient Position: Sitting, Cuff Size: Normal)   Pulse 61   Ht 5\' 9"  (1.753 m)   Wt 214 lb 6.4 oz (97.3 kg)  BMI 31.66 kg/m  , BMI Body mass index is 31.66 kg/m. GEN: Well nourished, well developed, in no acute distress HEENT: normal Neck: no JVD, carotid bruits, or masses Cardiac: RRR; occasional skipped systole, no murmurs, rubs, or gallops,no edema  Respiratory:  Clear to auscultation bilaterally, normal work of breathing GI: soft, nontender, nondistended, + BS MS: no deformity or atrophy Skin: warm and dry, no rash Neuro:  Strength and sensation are intact Psych: euthymic mood, full affect   EKG:  Sinus bradycardia, with occasional PVC's rate of 55 bpm.   Recent Labs: 03/27/2017: BUN 26; Creatinine, Ser 1.53; Potassium 5.0; Sodium 141 09/24/2017: ALT 15    Lipid Panel    Component Value Date/Time   CHOL 168 09/24/2017 0748   CHOL 182 02/09/2014 0803   TRIG 206 (H)  09/24/2017 0748   TRIG 177 (H) 02/09/2014 0803   HDL 52 09/24/2017 0748   HDL 65 02/09/2014 0803   CHOLHDL 3.2 09/24/2017 0748   CHOLHDL 2.3 04/03/2015 0742   VLDL 26 04/03/2015 0742   LDLCALC 75 09/24/2017 0748   LDLCALC 82 02/09/2014 0803      Wt Readings from Last 3 Encounters:  03/09/18 214 lb 6.4 oz (97.3 kg)  04/07/17 215 lb (97.5 kg)  03/27/17 223 lb (101.2 kg)      Other studies Reviewed: Renal Artery [place] 04/04/2017 FINAL INTERPRETATION: Renal:   Right: Normal size right kidney. No evidence of right renal artery        stenosis. RRV flow present. Left:  Normal size of left kidney. No evidence of left renal artery        stenosis. LRV flow present. Could not directly image the        stent secondary to body habitus. *See table(s) above for measurements and observations.    ASSESSMENT AND PLAN:  1.  Pre-Operative cardiac evaluation:  Chart reviewed as part of pre-operative protocol coverage. Given past medical history and time since last visit, based on ACC/AHA guidelines, Chad [name] would be at acceptable risk for the planned procedure without further cardiovascular testing. He will hold his Brilinta and ASA for 5 days prior to the circumcsion. Start ASAP thereafter.   2. CAD: Multiple stents. He remains on DAPT but will hold this today until after surgery as above. Continue secondary prevention,  3. Hypercholesterolemia: He will continue statin therapy with goal of LDL < 70.   4. Hypertension: Continue lisinopril.    Current medicines are reviewed at length with the patient today.    Labs/ tests ordered today include: None Phill Myron. West Pugh, ANP, AACC   03/09/2018 2:32 PM    [address] Office [phone] Fax [phone]

## 2018-03-09 ENCOUNTER — Ambulatory Visit: Payer: Medicare HMO | Admitting: Adult Health

## 2018-03-09 ENCOUNTER — Encounter: Payer: Self-pay | Admitting: Adult Health

## 2018-03-09 VITALS — BP 121/75 | HR 61 | Ht 69.0 in | Wt 214.4 lb

## 2018-03-09 DIAGNOSIS — I2510 Atherosclerotic heart disease of native coronary artery without angina pectoris: Secondary | ICD-10-CM | POA: Diagnosis not present

## 2018-03-09 DIAGNOSIS — E7800 Pure hypercholesterolemia, unspecified: Secondary | ICD-10-CM

## 2018-03-09 DIAGNOSIS — I10 Essential (primary) hypertension: Secondary | ICD-10-CM

## 2018-03-09 DIAGNOSIS — Z01810 Encounter for preprocedural cardiovascular examination: Secondary | ICD-10-CM | POA: Diagnosis not present

## 2018-03-09 NOTE — Patient Instructions (Signed)
CLEARED FOR YOUR SCHEDULED PROCEDURE  Follow-Up: You will need a follow up appointment in [place], MD or one of the following Advanced Practice Providers on your designated Care Team:  [place], PA-C   Dayna Dunn, PA-C   Ermalinda Barrios, PA-C    Medication Instructions:  OK TO [place] RE-START Saturday AFTER PROCEDURE Your physician recommends that you continue on your current medications as directed. Please refer to the Current Medication list given to you today. If you need a refill on your cardiac medications before your next appointment, please call your pharmacy. Labwork: When you have labs (blood work) and your tests are completely normal, you will receive your results ONLY by [place] (if you have MyChart) -OR- A paper copy in the mail.  At [facility], you and your health needs are our priority.  As part of our continuing mission to provide you with exceptional heart care, we have created designated Provider Care Teams.  These Care Teams include your primary Cardiologist (physician) and Advanced Practice Providers (APPs -  Physician Assistants and Nurse Practitioners) who all work together to provide you with the care you need, when you need it.  Thank you for choosing [facility] at [facility]!!

## 2018-03-10 ENCOUNTER — Encounter (HOSPITAL_BASED_OUTPATIENT_CLINIC_OR_DEPARTMENT_OTHER): Payer: Self-pay | Admitting: *Deleted

## 2018-03-10 ENCOUNTER — Other Ambulatory Visit: Payer: Self-pay

## 2018-03-10 NOTE — Addendum Note (Signed)
Addended by: Waylan Rocher on: 03/10/2018 01:55 PM   Modules accepted: Orders

## 2018-03-10 NOTE — Progress Notes (Addendum)
Spoke with [place] Npo after midnight food, clear liquids until 700 am then npo, arrive 1100 am 03-13-18 [facility] meds to take " finasteride, pantaprazole Records on chart:/epic:ekg 04-06-17, cardiac clearance Curt Bears lawrence np 03-09-18, renal angiography 03-04-17 Driver wife penny cell [phone] Has surgery orders in epic  needs I stat 4

## 2018-03-13 ENCOUNTER — Ambulatory Visit (HOSPITAL_BASED_OUTPATIENT_CLINIC_OR_DEPARTMENT_OTHER)
Admission: RE | Admit: 2018-03-13 | Discharge: 2018-03-13 | Disposition: A | Discharge status: Home / Self Care | Payer: Medicare HMO | Attending: Urology | Admitting: Urology

## 2018-03-13 ENCOUNTER — Ambulatory Visit (HOSPITAL_BASED_OUTPATIENT_CLINIC_OR_DEPARTMENT_OTHER): Payer: Medicare HMO | Admitting: Anesthesiology

## 2018-03-13 ENCOUNTER — Other Ambulatory Visit: Payer: Self-pay

## 2018-03-13 ENCOUNTER — Encounter (HOSPITAL_BASED_OUTPATIENT_CLINIC_OR_DEPARTMENT_OTHER): Payer: Self-pay | Admitting: Anesthesiology

## 2018-03-13 ENCOUNTER — Encounter (HOSPITAL_BASED_OUTPATIENT_CLINIC_OR_DEPARTMENT_OTHER)
Admission: RE | Disposition: A | Discharge status: Home / Self Care | Payer: Self-pay | Source: Home / Self Care | Attending: Urology

## 2018-03-13 DIAGNOSIS — I701 Atherosclerosis of renal artery: Secondary | ICD-10-CM | POA: Diagnosis not present

## 2018-03-13 DIAGNOSIS — Z87442 Personal history of urinary calculi: Secondary | ICD-10-CM | POA: Diagnosis not present

## 2018-03-13 DIAGNOSIS — Z8249 Family history of ischemic heart disease and other diseases of the circulatory system: Secondary | ICD-10-CM | POA: Diagnosis not present

## 2018-03-13 DIAGNOSIS — E782 Mixed hyperlipidemia: Secondary | ICD-10-CM | POA: Diagnosis not present

## 2018-03-13 DIAGNOSIS — Z955 Presence of coronary angioplasty implant and graft: Secondary | ICD-10-CM | POA: Diagnosis not present

## 2018-03-13 DIAGNOSIS — N477 Other inflammatory diseases of prepuce: Secondary | ICD-10-CM | POA: Diagnosis not present

## 2018-03-13 DIAGNOSIS — N471 Phimosis: Secondary | ICD-10-CM | POA: Diagnosis not present

## 2018-03-13 DIAGNOSIS — K449 Diaphragmatic hernia without obstruction or gangrene: Secondary | ICD-10-CM | POA: Insufficient documentation

## 2018-03-13 DIAGNOSIS — Z6831 Body mass index (BMI) 31.0-31.9, adult: Secondary | ICD-10-CM | POA: Diagnosis not present

## 2018-03-13 DIAGNOSIS — I2510 Atherosclerotic heart disease of native coronary artery without angina pectoris: Secondary | ICD-10-CM | POA: Diagnosis not present

## 2018-03-13 DIAGNOSIS — I10 Essential (primary) hypertension: Secondary | ICD-10-CM | POA: Diagnosis not present

## 2018-03-13 DIAGNOSIS — Z880 Allergy status to penicillin: Secondary | ICD-10-CM | POA: Diagnosis not present

## 2018-03-13 DIAGNOSIS — Z79899 Other long term (current) drug therapy: Secondary | ICD-10-CM | POA: Diagnosis not present

## 2018-03-13 DIAGNOSIS — E669 Obesity, unspecified: Secondary | ICD-10-CM | POA: Insufficient documentation

## 2018-03-13 DIAGNOSIS — I509 Heart failure, unspecified: Secondary | ICD-10-CM | POA: Diagnosis not present

## 2018-03-13 DIAGNOSIS — I110 Hypertensive heart disease with heart failure: Secondary | ICD-10-CM | POA: Diagnosis not present

## 2018-03-13 DIAGNOSIS — Z8551 Personal history of malignant neoplasm of bladder: Secondary | ICD-10-CM | POA: Insufficient documentation

## 2018-03-13 HISTORY — DX: Malignant neoplasm of bladder, unspecified: C67.9

## 2018-03-13 HISTORY — DX: Diverticulosis of intestine, part unspecified, without perforation or abscess without bleeding: K57.90

## 2018-03-13 HISTORY — DX: Hydrocele, unspecified: N43.3

## 2018-03-13 HISTORY — DX: Personal history of other diseases of the digestive system: Z87.19

## 2018-03-13 HISTORY — DX: Disorder of kidney and ureter, unspecified: N28.9

## 2018-03-13 HISTORY — DX: Phimosis: N47.1

## 2018-03-13 HISTORY — PX: CIRCUMCISION: SHX1350

## 2018-03-13 HISTORY — DX: Personal history of urinary calculi: Z87.442

## 2018-03-13 HISTORY — DX: Atherosclerosis of aorta: I70.0

## 2018-03-13 HISTORY — DX: Atherosclerosis of renal artery: I70.1

## 2018-03-13 LAB — POCT I-STAT 4, (NA,K, GLUC, HGB,HCT)
Glucose, Bld: 100 mg/dL — ABNORMAL HIGH (ref 70–99)
HCT: 36 % — ABNORMAL LOW (ref 39.0–52.0)
Hemoglobin: 12.2 g/dL — ABNORMAL LOW (ref 13.0–17.0)
Potassium: 4.7 mmol/L (ref 3.5–5.1)
Sodium: 140 mmol/L (ref 135–145)

## 2018-03-13 SURGERY — CIRCUMCISION, ADULT
Anesthesia: General | Site: Penis

## 2018-03-13 MED ORDER — PROPOFOL 10 MG/ML IV BOLUS
INTRAVENOUS | Status: DC | PRN
  Administered 2018-03-13: 150 mg via INTRAVENOUS

## 2018-03-13 MED ORDER — BACITRACIN 500 UNIT/GM EX OINT
TOPICAL_OINTMENT | CUTANEOUS | Status: DC | PRN
  Administered 2018-03-13: 1 via TOPICAL

## 2018-03-13 MED ORDER — LIDOCAINE 2% (20 MG/ML) 5 ML SYRINGE
INTRAMUSCULAR | Status: AC
  Filled 2018-03-13: qty 5

## 2018-03-13 MED ORDER — PROPOFOL 10 MG/ML IV BOLUS
INTRAVENOUS | Status: AC
  Filled 2018-03-13: qty 40

## 2018-03-13 MED ORDER — TRAMADOL HCL 50 MG PO TABS: 50.0000 mg | ORAL_TABLET | Freq: Four times a day (QID) | ORAL | 0 refills | Status: AC | PRN

## 2018-03-13 MED ORDER — LIDOCAINE 2% (20 MG/ML) 5 ML SYRINGE
INTRAMUSCULAR | Status: DC | PRN
  Administered 2018-03-13: 100 mg via INTRAVENOUS

## 2018-03-13 MED ORDER — BUPIVACAINE HCL 0.5 % IJ SOLN
INTRAMUSCULAR | Status: DC | PRN
  Administered 2018-03-13: 10 mL

## 2018-03-13 MED ORDER — CLINDAMYCIN PHOSPHATE 900 MG/50ML IV SOLN
900.0000 mg | Freq: Once | INTRAVENOUS | Status: AC
  Administered 2018-03-13: 900 mg via INTRAVENOUS
  Filled 2018-03-13: qty 50

## 2018-03-13 MED ORDER — DEXAMETHASONE SODIUM PHOSPHATE 4 MG/ML IJ SOLN
INTRAMUSCULAR | Status: DC | PRN
  Administered 2018-03-13: 10 mg via INTRAVENOUS

## 2018-03-13 MED ORDER — ONDANSETRON HCL 4 MG/2ML IJ SOLN
INTRAMUSCULAR | Status: AC
  Filled 2018-03-13: qty 2

## 2018-03-13 MED ORDER — SULFAMETHOXAZOLE-TRIMETHOPRIM 800-160 MG PO TABS: 1.0000 | ORAL_TABLET | Freq: Two times a day (BID) | ORAL | 0 refills | Status: AC

## 2018-03-13 MED ORDER — ONDANSETRON HCL 4 MG/2ML IJ SOLN
INTRAMUSCULAR | Status: DC | PRN
  Administered 2018-03-13: 4 mg via INTRAVENOUS

## 2018-03-13 MED ORDER — FENTANYL CITRATE (PF) 100 MCG/2ML IJ SOLN
INTRAMUSCULAR | Status: DC | PRN
  Administered 2018-03-13 (×4): 25 ug via INTRAVENOUS

## 2018-03-13 MED ORDER — MIDAZOLAM HCL 2 MG/2ML IJ SOLN
INTRAMUSCULAR | Status: AC
  Filled 2018-03-13: qty 2

## 2018-03-13 MED ORDER — CLINDAMYCIN PHOSPHATE 900 MG/50ML IV SOLN
INTRAVENOUS | Status: AC
  Filled 2018-03-13: qty 50

## 2018-03-13 MED ORDER — FENTANYL CITRATE (PF) 100 MCG/2ML IJ SOLN
INTRAMUSCULAR | Status: AC
  Filled 2018-03-13: qty 2

## 2018-03-13 MED ORDER — LACTATED RINGERS IV SOLN
INTRAVENOUS | Status: DC
  Administered 2018-03-13: 12:00:00 via INTRAVENOUS
  Filled 2018-03-13: qty 1000

## 2018-03-13 MED ORDER — PHENYLEPHRINE HCL 10 MG/ML IJ SOLN
INTRAMUSCULAR | Status: AC
  Filled 2018-03-13: qty 1

## 2018-03-13 MED ORDER — PHENYLEPHRINE HCL 10 MG/ML IJ SOLN
INTRAMUSCULAR | Status: DC | PRN
  Administered 2018-03-13 (×2): 40 ug via INTRAVENOUS

## 2018-03-13 MED ORDER — MIDAZOLAM HCL 5 MG/5ML IJ SOLN
INTRAMUSCULAR | Status: DC | PRN
  Administered 2018-03-13: 2 mg via INTRAVENOUS

## 2018-03-13 MED ORDER — DEXAMETHASONE SODIUM PHOSPHATE 10 MG/ML IJ SOLN
INTRAMUSCULAR | Status: AC
  Filled 2018-03-13: qty 1

## 2018-03-13 SURGICAL SUPPLY — 35 items
BANDAGE COBAN STERILE 2 (GAUZE/BANDAGES/DRESSINGS) ×2 IMPLANT
BLADE SURG 15 STRL LF DISP TIS (BLADE) ×1 IMPLANT
BLADE SURG 15 STRL SS (BLADE) ×2
BNDG COHESIVE 2X5 TAN STRL LF (GAUZE/BANDAGES/DRESSINGS) ×1 IMPLANT
BNDG CONFORM 2 STRL LF (GAUZE/BANDAGES/DRESSINGS) ×2 IMPLANT
COVER BACK TABLE 60X90IN (DRAPES) ×2 IMPLANT
COVER MAYO STAND STRL (DRAPES) ×2 IMPLANT
COVER WAND RF STERILE (DRAPES) ×3 IMPLANT
DECANTER SPIKE VIAL GLASS SM (MISCELLANEOUS) IMPLANT
DRAPE LAPAROTOMY 100X72 PEDS (DRAPES) ×2 IMPLANT
ELECT NDL TIP 2.8 STRL (NEEDLE) ×1 IMPLANT
ELECT NEEDLE TIP 2.8 STRL (NEEDLE) ×2 IMPLANT
ELECT REM PT RETURN 9FT ADLT (ELECTROSURGICAL) ×2
ELECTRODE REM PT RTRN 9FT ADLT (ELECTROSURGICAL) ×1 IMPLANT
GAUZE PETROLATUM 1 X8 (GAUZE/BANDAGES/DRESSINGS) ×2 IMPLANT
GAUZE SPONGE 4X4 12PLY STRL (GAUZE/BANDAGES/DRESSINGS) ×1 IMPLANT
GLOVE BIOGEL M STRL SZ7.5 (GLOVE) ×2 IMPLANT
GLOVE BIOGEL PI IND STRL 7.5 (GLOVE) ×1 IMPLANT
GLOVE BIOGEL PI INDICATOR 7.5 (GLOVE) ×1
GOWN STRL REUS W/ TWL XL LVL3 (GOWN DISPOSABLE) ×1 IMPLANT
GOWN STRL REUS W/TWL XL LVL3 (GOWN DISPOSABLE) ×2
KIT TURNOVER CYSTO (KITS) ×2 IMPLANT
NDL HYPO 25X1 1.5 SAFETY (NEEDLE) IMPLANT
NEEDLE HYPO 25X1 1.5 SAFETY (NEEDLE) ×2 IMPLANT
NS IRRIG 500ML POUR BTL (IV SOLUTION) ×1 IMPLANT
PACK BASIN DAY SURGERY FS (CUSTOM PROCEDURE TRAY) ×2 IMPLANT
PENCIL BUTTON HOLSTER BLD 10FT (ELECTRODE) ×2 IMPLANT
SUT CHROMIC 3 0 SH 27 (SUTURE) ×4 IMPLANT
SUT PROLENE 4 0 RB 1 (SUTURE)
SUT PROLENE 4-0 RB1 .5 CRCL 36 (SUTURE) ×1 IMPLANT
SUT VICRYL 4-0 PS2 18IN ABS (SUTURE) IMPLANT
SYR CONTROL 10ML LL (SYRINGE) ×1 IMPLANT
TOWEL OR 17X24 6PK STRL BLUE (TOWEL DISPOSABLE) ×2 IMPLANT
TRAY DSU PREP LF (CUSTOM PROCEDURE TRAY) ×2 IMPLANT
WATER STERILE IRR 500ML POUR (IV SOLUTION) IMPLANT

## 2018-03-13 NOTE — Transfer of Care (Signed)
Immediate Anesthesia Transfer of Care Note  Patient: Chad [name]  Procedure(s) Performed: Procedure(s) (LRB): CIRCUMCISION ADULT (N/A)  Patient Location: PACU  Anesthesia Type: General  Level of Consciousness: awake, sedated, patient cooperative and responds to stimulation  Airway & Oxygen Therapy: Patient Spontanous Breathing and Patient connected to  oxygen  Post-op Assessment: Report given to PACU RN, Post -op Vital signs reviewed and stable and Patient moving all extremities  Post vital signs: Reviewed and stable  Complications: No apparent anesthesia complications

## 2018-03-13 NOTE — Discharge Instructions (Signed)
1.  Apply neosporin or Vaseline around incision once daily    Post Anesthesia Home Care Instructions  Activity: Get plenty of rest for the remainder of the day. A responsible individual must stay with you for 24 hours following the procedure.  For the next 24 hours, DO NOT: -Drive a car -Paediatric nurse -Drink alcoholic beverages -Take any medication unless instructed by your physician -Make any legal decisions or sign important papers.  Meals: Start with liquid foods such as gelatin or soup. Progress to regular foods as tolerated. Avoid greasy, spicy, heavy foods. If nausea and/or vomiting occur, drink only clear liquids until the nausea and/or vomiting subsides. Call your physician if vomiting continues.  Special Instructions/Symptoms: Your throat may feel dry or sore from the anesthesia or the breathing tube placed in your throat during surgery. If this causes discomfort, gargle with warm salt water. The discomfort should disappear within 24 hours.  If you had a scopolamine patch placed behind your ear for the management of post- operative nausea and/or vomiting:  1. The medication in the patch is effective for 72 hours, after which it should be removed.  Wrap patch in a tissue and discard in the trash. Wash hands thoroughly with soap and water. 2. You may remove the patch earlier than 72 hours if you experience unpleasant side effects which may include dry mouth, dizziness or visual disturbances. 3. Avoid touching the patch. Wash your hands with soap and water after contact with the patch.

## 2018-03-13 NOTE — Anesthesia Postprocedure Evaluation (Signed)
Anesthesia Post Note  Patient: Chad [name]  Procedure(s) Performed: CIRCUMCISION ADULT (N/A Penis)     Patient location during evaluation: PACU Anesthesia Type: General Level of consciousness: awake and alert Pain management: pain level controlled Vital Signs Assessment: post-procedure vital signs reviewed and stable Respiratory status: spontaneous breathing, nonlabored ventilation, respiratory function stable and patient connected to nasal cannula oxygen Cardiovascular status: blood pressure returned to baseline and stable Postop Assessment: no apparent nausea or vomiting Anesthetic complications: no    Last Vitals:  Vitals:   03/13/18 1250 03/13/18 1300  BP: (!) 110/52 105/68  Pulse: (!) 51 (!) 50  Resp: 10 12  Temp: (!) 36.3 C   SpO2: 100% 100%    Last Pain:  Vitals:   03/13/18 1300  TempSrc:   PainSc: Asleep                 Catalina Gravel

## 2018-03-13 NOTE — Op Note (Signed)
Operative Note  Preoperative diagnosis:  1.  Phimosis  Postoperative diagnosis: 1.  Same  Procedure(s): 1.  Circumcision  Surgeon: Ellison Hughs, MD  Assistants:  None  Anesthesia:  General  Complications:  None  EBL: 5 mL  Specimens: 1.  Foreskin  Drains/Catheters: 1.  None  Intraoperative findings:   1. Phimosis  Indication:  Chad [name] is a 73 y.o. male with phimosis of the penile foreskin.  He has been consented for the above procedures, voices understanding and wishes to proceed.  Description of procedure:  After informed consent was obtained, the patient was brought to the operating room and general LMA anesthesia was administered.  The patient was prepped and draped in the usual fashion.  A timeout was then performed.  Circumferential marks were then made with the first being with the foreskin in the anatomic position along the glandular impression on the second being with the foreskin retracted approximately 1 cm from the coronal sulcus.  The marks were then incised using electrocautery and the excess foreskin was excised.  The penile shaft skin was then reapproximated using interrupted 3-0 chromic suture.  A penile ring block was then performed using quarter percent Marcaine without epinephrine.  The penis was then dressed in the usual fashion.  The patient tolerated the procedure well and was transferred to the postanesthesia unit in stable condition.  Plan:  Remove dressing in 24 hours.

## 2018-03-13 NOTE — Anesthesia Procedure Notes (Signed)
Procedure Name: LMA Insertion Date/Time: 03/13/2018 12:04 PM Performed by: Justice Rocher, CRNA Pre-anesthesia Checklist: Patient identified, Emergency Drugs available, Suction available and Patient being monitored Patient Re-evaluated:Patient Re-evaluated prior to induction Oxygen Delivery Method: Circle system utilized Preoxygenation: Pre-oxygenation with 100% oxygen Induction Type: IV induction Ventilation: Mask ventilation without difficulty LMA: LMA inserted LMA Size: 5.0 Number of attempts: 1 Airway Equipment and Method: Bite block Placement Confirmation: positive ETCO2 and breath sounds checked- equal and bilateral Tube secured with: Tape Dental Injury: Teeth and Oropharynx as per pre-operative assessment

## 2018-03-13 NOTE — H&P (Signed)
Urology Preoperative H&P   Chief Complaint: Cannot retract foreskin  History of Present Illness: Chad [name] is a 73 y.o. male with a history of low grade Ta UCC diagnosed and treated in April of 2011. He also has a history of renal artery stenosis and underwent angioplasty with Dr. Trula Slade. PMHx of CAD, s/p multiple stents (currently on Brilinta).   The patient is here today for a surveillance cystoscopy. From a urinary standpoint, he reports a good FOS and feels like he is emptying his bladder well. He has occasional urgency/frequency, but is not bothered by it. Nocturia x 1. Denies interval UTIs, dysuria or hematuria.   He reports that he is cannot retract his foreskin due to phimosis.  He has tried topical steroid creams in the past with minimal improvement.  He states that it is very uncomfortable when he tries to clean his glans.  He denies prior episodes of balanitis or bleeding.   Past Medical History:  Diagnosis Date  . Aortic atherosclerosis ([place])   . Bladder cancer ([place]) 2011  . Coronary atherosclerosis of native coronary artery   . Diverticulosis   . History of hiatal hernia    Small  . History of kidney stones    18mm nonobstructive calculus in the interpolar, left kidney  . Hypertension   . Left hydrocele   . Left renal artery stenosis ([place])   . Mixed hyperlipidemia   . Phimosis   . Renal insufficiency     Past Surgical History:  Procedure Laterality Date  . APPENDECTOMY    . heart stent  x 7  . RENAL ANGIOGRAPHY N/A 03/04/2017   Procedure: RENAL ANGIOGRAPHY;  Surgeon: Serafina Mitchell, MD;  Location: [facility];  Service: Cardiovascular;  Laterality: N/A;  . RENAL ARTERY STENT  03/04/2017  . TONSILLECTOMY    . TRANSURETHRAL RESECTION OF BLADDER TUMOR      Allergies:  Allergies  Allergen Reactions  . Penicillins Rash and Other (See Comments)    Has patient had a PCN reaction causing immediate rash, facial/tongue/throat swelling, SOB or  lightheadedness with hypotension: No Has patient had a PCN reaction causing severe rash involving mucus membranes or skin necrosis: No Has patient had a PCN reaction that required hospitalization: No Has patient had a PCN reaction occurring within the last 10 years: No If all of the above answers are "NO", then may proceed with Cephalosporin use.     Family History  Problem Relation Age of Onset  . CAD Mother   . Heart attack Mother   . CAD Father   . Heart attack Father   . CAD Brother   . Stroke Neg Hx     Social History:  reports that he has never smoked. He has never used smokeless tobacco. He reports current alcohol use. He reports that he does not use drugs.  ROS: A complete review of systems was performed.  All systems are negative except for pertinent findings as noted.  Physical Exam:  Vital signs in last 24 hours: Temp:  [97.5 F (36.4 C)] 97.5 F (36.4 C) (01/31 1046) Pulse Rate:  [51] 51 (01/31 1046) Resp:  [16] 16 (01/31 1046) BP: (128)/(80) 128/80 (01/31 1046) SpO2:  [99 %] 99 % (01/31 1046) Weight:  [97.9 kg] 97.9 kg (01/31 1046) Constitutional:  Alert and oriented, No acute distress Cardiovascular: Regular rate and rhythm, No JVD Respiratory: Normal respiratory effort, Lungs clear bilaterally GI: Abdomen is soft, nontender, nondistended, no abdominal masses GU: No  CVA tenderness Lymphatic: No lymphadenopathy Neurologic: Grossly intact, no focal deficits Psychiatric: Normal mood and affect  Laboratory Data:  No results for input(s): WBC, HGB, HCT, PLT in the last 72 hours.  No results for input(s): NA, K, CL, GLUCOSE, BUN, CALCIUM, CREATININE in the last 72 hours.  Invalid input(s): CO3   No results found for this or any previous visit (from the past 24 hour(s)). No results found for this or any previous visit (from the past 240 hour(s)).  Renal Function: No results for input(s): CREATININE in the last 168 hours. CrCl cannot be calculated  (Patient's most recent lab result is older than the maximum 21 days allowed.).  Radiologic Imaging: No results found.  I independently reviewed the above imaging studies.  Assessment and Plan Chad [name] is a 73 y.o. male with phimosis  The risks, benefits and alternatives of circumcision was discussed with the patient. Risks include, but are not limited to, bleed, skin infection, scar formation, recurrence of phimosis, MI, CVA, PE, DVT as well as the inherent risks of general anesthesia. He voices understanding and wishes to proceed.   Ellison Hughs, MD 03/13/2018, 11:09 AM  [facility] Pager: [phone]

## 2018-03-13 NOTE — Anesthesia Preprocedure Evaluation (Addendum)
Anesthesia Evaluation  Patient identified by MRN, date of birth, ID band Patient awake    Reviewed: Allergy & Precautions, NPO status , Patient's Chart, lab work & pertinent test results  History of Anesthesia Complications Negative for: history of anesthetic complications  Airway Mallampati: III  TM Distance: >3 FB Neck ROM: Full    Dental  (+) Dental Advisory Given, Teeth Intact   Pulmonary neg pulmonary ROS,    breath sounds clear to auscultation       Cardiovascular Exercise Tolerance: Good hypertension, Pt. on medications (-) angina+ CAD and + Cardiac Stents   Rhythm:Regular Rate:Normal     Neuro/Psych negative neurological ROS  negative psych ROS   GI/Hepatic Neg liver ROS, hiatal hernia,   Endo/Other   Obesity   Renal/GU Renal disease (left renal artery stenosis now improved s/p stent)    Bladder cancer     Musculoskeletal negative musculoskeletal ROS (+)   Abdominal   Peds  Hematology negative hematology ROS (+)   Anesthesia Other Findings   Reproductive/Obstetrics                            Anesthesia Physical Anesthesia Plan  ASA: III  Anesthesia Plan: General   Post-op Pain Management:    Induction: Intravenous  PONV Risk Score and Plan: 3 and Treatment may vary due to age or medical condition, Ondansetron and Dexamethasone  Airway Management Planned: LMA  Additional Equipment: None  Intra-op Plan:   Post-operative Plan: Extubation in OR  Informed Consent: I have reviewed the patients History and Physical, chart, labs and discussed the procedure including the risks, benefits and alternatives for the proposed anesthesia with the patient or authorized representative who has indicated his/her understanding and acceptance.     Dental advisory given  Plan Discussed with: CRNA and Anesthesiologist  Anesthesia Plan Comments:        Anesthesia Quick  Evaluation

## 2018-03-16 ENCOUNTER — Encounter (HOSPITAL_BASED_OUTPATIENT_CLINIC_OR_DEPARTMENT_OTHER): Payer: Self-pay | Admitting: Urology

## 2018-03-26 ENCOUNTER — Encounter: Payer: Self-pay | Admitting: Interventional Cardiology

## 2018-04-03 ENCOUNTER — Other Ambulatory Visit: Payer: Self-pay | Admitting: Interventional Cardiology

## 2018-04-10 DIAGNOSIS — N471 Phimosis: Secondary | ICD-10-CM | POA: Diagnosis not present

## 2018-04-13 ENCOUNTER — Ambulatory Visit: Payer: Medicare HMO | Admitting: Interventional Cardiology

## 2018-04-13 ENCOUNTER — Encounter: Payer: Self-pay | Admitting: Interventional Cardiology

## 2018-04-13 VITALS — BP 108/64 | HR 73 | Ht 69.0 in | Wt 214.4 lb

## 2018-04-13 DIAGNOSIS — I2510 Atherosclerotic heart disease of native coronary artery without angina pectoris: Secondary | ICD-10-CM | POA: Diagnosis not present

## 2018-04-13 DIAGNOSIS — E7800 Pure hypercholesterolemia, unspecified: Secondary | ICD-10-CM | POA: Diagnosis not present

## 2018-04-13 DIAGNOSIS — Z9861 Coronary angioplasty status: Secondary | ICD-10-CM | POA: Diagnosis not present

## 2018-04-13 DIAGNOSIS — I119 Hypertensive heart disease without heart failure: Secondary | ICD-10-CM | POA: Diagnosis not present

## 2018-04-13 NOTE — Progress Notes (Signed)
Cardiology Office Note   Date:  04/13/2018   ID:  Chad [name], DOB 1945-07-20, MRN [id number]  PCP:  Antony Contras, MD    No chief complaint on file.  CAD  Wt Readings from Last 3 Encounters:  04/13/18 214 lb 6.4 oz (97.3 kg)  03/13/18 215 lb 12.8 oz (97.9 kg)  03/09/18 214 lb 6.4 oz (97.3 kg)       History of Present Illness: Chad [name] is a 73 y.o. male  with known CAD. He had multiple stents to his right coronary arteryin 2012. He had an episode of chest discomfort and went to the hospital in 11/14. He ruled out for MI. He had an outpatient stress test which was negative for ischemia. He was started on Imdur then. Since that time, he has done well.   He was found to be resistant to Plavix in the past.   Heno longer goes to the beach 3-4 days a week. He works 3 days a Consulting civil engineer , as an Chief Financial Officer.  He joined a gym and does some light cardio.Marland KitchenMarland KitchenSilver Sneakers.  He had some renal insufficiency and a kidney stone, and was found to have renal artery stenosis.  A renal stent was placed in Jan 2019.   Denies : Chest pain. Dizziness. Leg edema. Nitroglycerin use. Orthopnea. Palpitations. Paroxysmal nocturnal dyspnea. Shortness of breath. Syncope.   He lives at [employer] on the weekends.    Circumcision went well.  Back on Brilinta.    He does try to eat healthy but admits that he is not terribly strict.    Past Medical History:  Diagnosis Date  . Aortic atherosclerosis ([place])   . Bladder cancer ([place]) 2011  . Coronary atherosclerosis of native coronary artery   . Diverticulosis   . History of hiatal hernia    Small  . History of kidney stones    6mm nonobstructive calculus in the interpolar, left kidney  . Hypertension   . Left hydrocele   . Left renal artery stenosis ([place])   . Mixed hyperlipidemia   . Phimosis   . Renal insufficiency     Past Surgical History:  Procedure Laterality Date  . APPENDECTOMY    . CIRCUMCISION N/A 03/13/2018   Procedure: CIRCUMCISION ADULT;  Surgeon: Ceasar Mons, MD;  Location: [facility];  Service: Urology;  Laterality: N/A;  . heart stent  x 7  . RENAL ANGIOGRAPHY N/A 03/04/2017   Procedure: RENAL ANGIOGRAPHY;  Surgeon: Serafina Mitchell, MD;  Location: [place] [facility];  Service: Cardiovascular;  Laterality: N/A;  . RENAL ARTERY STENT  03/04/2017  . TONSILLECTOMY    . TRANSURETHRAL RESECTION OF BLADDER TUMOR       Current Outpatient Medications  Medication Sig Dispense Refill  . aspirin EC 81 MG tablet Take 81 mg by mouth daily.    Marland Kitchen atorvastatin (LIPITOR) 40 MG tablet Take 1 tablet (40 mg total) by mouth daily. (Patient taking differently: Take 40 mg by mouth every evening. ) 90 tablet 3  . BRILINTA 90 MG TABS tablet TAKE 1 TABLET(90 MG) BY MOUTH TWICE DAILY. 180 tablet 3  . finasteride (PROSCAR) 5 MG tablet Take 5 mg by mouth daily.    . folic acid (FOLVITE) 211 MCG tablet Take 800 mcg by mouth daily.     Marland Kitchen lisinopril (PRINIVIL,ZESTRIL) 10 MG tablet TAKE 1 TABLET BY MOUTH EVERY DAY 90 tablet 3  . niacin 500 MG tablet Take 500 mg by mouth  at bedtime.    . nitroGLYCERIN (NITROSTAT) 0.4 MG SL tablet Place 1 tablet (0.4 mg total) under the tongue every 5 (five) minutes as needed for chest pain. 25 tablet 3  . pantoprazole (PROTONIX) 40 MG tablet TAKE 1 TABLET(40 MG) BY MOUTH DAILY 90 tablet 0   No current facility-administered medications for this visit.     Allergies:   Penicillins    Social History:  The patient  reports that he has never smoked. He has never used smokeless tobacco. He reports current alcohol use. He reports that he does not use drugs.   Family History:  The patient's family history includes CAD in his brother, father, and mother; Heart attack in his father and mother.    ROS:  Please see the history of present illness.   Otherwise, review of systems are positive for stable weight.   All other systems are reviewed and negative.     PHYSICAL EXAM: VS:  BP 108/64   Pulse 73   Ht 5\' 9"  (1.753 m)   Wt 214 lb 6.4 oz (97.3 kg)   SpO2 97%   BMI 31.66 kg/m  , BMI Body mass index is 31.66 kg/m. GEN: Well nourished, well developed, in no acute distress  HEENT: normal  Neck: no JVD, carotid bruits, or masses Cardiac: RRR; no murmurs, rubs, or gallops,no edema  Respiratory:  clear to auscultation bilaterally, normal work of breathing GI: soft, nontender, nondistended, + BS MS: no deformity or atrophy  Skin: warm and dry, no rash Neuro:  Strength and sensation are intact Psych: euthymic mood, full affect   EKG:   The ekg ordered in Jan 2020 demonstrates NSR, PVC, no ST changes   Recent Labs: 09/24/2017: ALT 15 03/13/2018: Hemoglobin 12.2; Potassium 4.7; Sodium 140   Lipid Panel    Component Value Date/Time   CHOL 168 09/24/2017 0748   CHOL 182 02/09/2014 0803   TRIG 206 (H) 09/24/2017 0748   TRIG 177 (H) 02/09/2014 0803   HDL 52 09/24/2017 0748   HDL 65 02/09/2014 0803   CHOLHDL 3.2 09/24/2017 0748   CHOLHDL 2.3 04/03/2015 0742   VLDL 26 04/03/2015 0742   LDLCALC 75 09/24/2017 0748   LDLCALC 82 02/09/2014 0803     Other studies Reviewed: Additional studies/ records that were reviewed today with results demonstrating: labs reviewed.   ASSESSMENT AND PLAN:  1. CAD: No angina on medical therapy.  Continue aggressive secondary prevention.  On Brilinta due to Plavix resistance. 2. Hyperlipidemia: Triglycerides elevated.  Will see if he would be a candidate for Vascepa.  Recheck lipids at this point.  If still elevated TG, refer to [facility] lipid clinic for [place].  Avoid processed foods, added sugar as well.  3. Hypertensive heart disease: BP controlled.   Current medicines are reviewed at length with the patient today.  The patient concerns regarding his medicines were addressed.  The following changes have been made:  No change  Labs/ tests ordered today include:  No orders of the defined types  were placed in this encounter.   Recommend 150 minutes/week of aerobic exercise Low fat, low carb, high fiber diet recommended  Disposition:   FU in 1 year   Signed, Larae Grooms, MD  04/13/2018 3:35 PM    [facility] [address], [place], [place]  [address] Phone: [phone]; Fax: [phone]

## 2018-04-13 NOTE — Patient Instructions (Signed)
Medication Instructions:  Your physician recommends that you continue on your current medications as directed. Please refer to the Current Medication list given to you today.  If you need a refill on your cardiac medications before your next appointment, please call your pharmacy.   Lab work: Your physician recommends that you return for a FASTING lipid profile   If you have labs (blood work) drawn today and your tests are completely normal, you will receive your results only by: Marland Kitchen MyChart Message (if you have MyChart) OR . A paper copy in the mail If you have any lab test that is abnormal or we need to change your treatment, we will call you to review the results.  Testing/Procedures: None ordered  Follow-Up: At [facility], you and your health needs are our priority.  As part of our continuing mission to provide you with exceptional heart care, we have created designated Provider Care Teams.  These Care Teams include your primary Cardiologist (physician) and Advanced Practice Providers (APPs -  Physician Assistants and Nurse Practitioners) who all work together to provide you with the care you need, when you need it. . You will need a follow up appointment in 1 year.  Please call our office 2 months in advance to schedule this appointment.  You may see Casandra Doffing, MD or one of the following Advanced Practice Providers on your designated Care Team:   . Lyda Jester, PA-C . Dayna Dunn, PA-C . Ermalinda Barrios, PA-C  Any Other Special Instructions Will Be Listed Below (If Applicable).

## 2018-04-17 ENCOUNTER — Other Ambulatory Visit: Payer: Self-pay | Admitting: *Deleted

## 2018-04-17 ENCOUNTER — Other Ambulatory Visit: Payer: Medicare HMO | Admitting: *Deleted

## 2018-04-17 DIAGNOSIS — E7800 Pure hypercholesterolemia, unspecified: Secondary | ICD-10-CM

## 2018-04-17 LAB — LIPID PANEL
Chol/HDL Ratio: 3.3 ratio (ref 0.0–5.0)
Cholesterol, Total: 182 mg/dL (ref 100–199)
HDL: 56 mg/dL (ref >39)
LDL Calculated: 92 mg/dL (ref 0–99)
TRIGLYCERIDES: 171 mg/dL — AB (ref 0–149)
VLDL Cholesterol Cal: 34 mg/dL (ref 5–40)

## 2018-04-19 ENCOUNTER — Other Ambulatory Visit: Payer: Self-pay | Admitting: Interventional Cardiology

## 2018-04-20 DIAGNOSIS — R69 Illness, unspecified: Secondary | ICD-10-CM | POA: Diagnosis not present

## 2018-04-21 ENCOUNTER — Other Ambulatory Visit: Payer: Self-pay

## 2018-04-21 DIAGNOSIS — E7800 Pure hypercholesterolemia, unspecified: Secondary | ICD-10-CM

## 2018-04-21 MED ORDER — ATORVASTATIN CALCIUM 80 MG PO TABS: 80.0000 mg | ORAL_TABLET | Freq: Every day | ORAL | 3 refills | Status: DC

## 2018-05-21 ENCOUNTER — Ambulatory Visit: Payer: Medicare HMO | Admitting: Family

## 2018-05-21 ENCOUNTER — Encounter (HOSPITAL_COMMUNITY): Payer: Medicare HMO

## 2018-05-23 ENCOUNTER — Other Ambulatory Visit: Payer: Self-pay | Admitting: Interventional Cardiology

## 2018-06-12 ENCOUNTER — Other Ambulatory Visit: Payer: Medicare HMO

## 2018-06-15 ENCOUNTER — Other Ambulatory Visit: Payer: Self-pay

## 2018-06-15 ENCOUNTER — Other Ambulatory Visit: Payer: Medicare HMO | Admitting: *Deleted

## 2018-06-15 DIAGNOSIS — E7800 Pure hypercholesterolemia, unspecified: Secondary | ICD-10-CM | POA: Diagnosis not present

## 2018-06-15 LAB — HEPATIC FUNCTION PANEL
ALT: 22 IU/L (ref 0–44)
AST: 19 IU/L (ref 0–40)
Albumin: 4.6 g/dL (ref 3.7–4.7)
Alkaline Phosphatase: 67 IU/L (ref 39–117)
Bilirubin Total: 0.8 mg/dL (ref 0.0–1.2)
Bilirubin, Direct: 0.21 mg/dL (ref 0.00–0.40)
Total Protein: 6.7 g/dL (ref 6.0–8.5)

## 2018-06-15 LAB — LIPID PANEL
Chol/HDL Ratio: 3.1 ratio (ref 0.0–5.0)
Cholesterol, Total: 177 mg/dL (ref 100–199)
HDL: 57 mg/dL (ref >39)
LDL Calculated: 84 mg/dL (ref 0–99)
Triglycerides: 179 mg/dL — ABNORMAL HIGH (ref 0–149)
VLDL Cholesterol Cal: 36 mg/dL (ref 5–40)

## 2018-09-01 ENCOUNTER — Other Ambulatory Visit: Payer: Self-pay

## 2018-09-01 DIAGNOSIS — I722 Aneurysm of renal artery: Secondary | ICD-10-CM

## 2018-09-01 DIAGNOSIS — I701 Atherosclerosis of renal artery: Secondary | ICD-10-CM

## 2018-09-07 ENCOUNTER — Ambulatory Visit (HOSPITAL_COMMUNITY)
Admission: RE | Admit: 2018-09-07 | Discharge: 2018-09-07 | Disposition: A | Discharge status: Home / Self Care | Payer: Medicare HMO | Source: Ambulatory Visit | Attending: Family | Admitting: Family

## 2018-09-07 ENCOUNTER — Other Ambulatory Visit: Payer: Self-pay

## 2018-09-07 ENCOUNTER — Ambulatory Visit: Payer: Medicare HMO | Admitting: Family

## 2018-09-07 ENCOUNTER — Encounter: Payer: Self-pay | Admitting: Family

## 2018-09-07 VITALS — BP 110/66 | HR 52 | Temp 97.4°F | Resp 18 | Ht 70.0 in | Wt 212.0 lb

## 2018-09-07 DIAGNOSIS — I722 Aneurysm of renal artery: Secondary | ICD-10-CM | POA: Insufficient documentation

## 2018-09-07 DIAGNOSIS — I701 Atherosclerosis of renal artery: Secondary | ICD-10-CM | POA: Diagnosis not present

## 2018-09-07 DIAGNOSIS — Z9889 Other specified postprocedural states: Secondary | ICD-10-CM

## 2018-09-07 NOTE — Progress Notes (Signed)
CC: Follow up renal artery stenosis  History of Present Illness  Chad [name] is a 73 y.o. (Oct 21, 1945) male who has a history of renal insufficiency. He underwent CT scan for follow-up of a bladder cancer and was found to have left renal atrophy indicative of compromised renal function. There was a concern for renal artery stenosis as a potential source.  On 03/04/2017 the patient underwent angiography and was found to have a 90% left renal artery stenosis which was successfully treated with a 6 mm balloon expandable stent.  He reports no complications from his procedure.  He has not noticed any significant change in his blood pressure which remains in good control.   Dr. Trula Slade last evaluated pt on 04-07-17. At that time renal artery duplex showed no evidence of renal artery stenosis on the left or right. Dr. Trula Slade advised follow-up in 1 year for repeat ultrasound.  Patient has a history of multiple stents to his heart in 2012. He suffers from hypertension which is managed with an ACE inhibitor. He takes a statin for hypercholesterolemia. He is on Brilinta and a daily 81 mg ASA. He takes Brilinta for several cardiac stents.   He denies any history of stroke or TIA. He denies any chest pain or dyspnea, denies fever or chills.   He denies fatigue or pain in his legs with walking.    Past Medical History:  Diagnosis Date  . Aortic atherosclerosis ([place])   . Bladder cancer ([place]) 2011  . Coronary atherosclerosis of native coronary artery   . Diverticulosis   . History of hiatal hernia    Small  . History of kidney stones    45mm nonobstructive calculus in the interpolar, left kidney  . Hypertension   . Left hydrocele   . Left renal artery stenosis ([place])   . Mixed hyperlipidemia   . Phimosis   . Renal insufficiency     Social History Social History   Tobacco Use  . Smoking status: Never Smoker  . Smokeless tobacco: Never Used  Substance Use Topics  . Alcohol  use: Yes    Comment: social  . Drug use: No    Frequency: 3.0 times per week    Family History Family History  Problem Relation Age of Onset  . CAD Mother   . Heart attack Mother   . CAD Father   . Heart attack Father   . CAD Brother   . Stroke Neg Hx     Surgical History Past Surgical History:  Procedure Laterality Date  . APPENDECTOMY    . CIRCUMCISION N/A 03/13/2018   Procedure: CIRCUMCISION ADULT;  Surgeon: Ceasar Mons, MD;  Location: [facility];  Service: Urology;  Laterality: N/A;  . heart stent  x 7  . RENAL ANGIOGRAPHY N/A 03/04/2017   Procedure: RENAL ANGIOGRAPHY;  Surgeon: Serafina Mitchell, MD;  Location: [facility];  Service: Cardiovascular;  Laterality: N/A;  . RENAL ARTERY STENT  03/04/2017  . TONSILLECTOMY    . TRANSURETHRAL RESECTION OF BLADDER TUMOR      Allergies  Allergen Reactions  . Penicillins Rash and Other (See Comments)    Has patient had a PCN reaction causing immediate rash, facial/tongue/throat swelling, SOB or lightheadedness with hypotension: No Has patient had a PCN reaction causing severe rash involving mucus membranes or skin necrosis: No Has patient had a PCN reaction that required hospitalization: No Has patient had a PCN reaction occurring within the last 10 years:  No If all of the above answers are "NO", then may proceed with Cephalosporin use.     Current Outpatient Medications  Medication Sig Dispense Refill  . aspirin EC 81 MG tablet Take 81 mg by mouth daily.    Marland Kitchen atorvastatin (LIPITOR) 80 MG tablet Take 1 tablet (80 mg total) by mouth daily. 90 tablet 3  . BRILINTA 90 MG TABS tablet TAKE 1 TABLET(90 MG) BY MOUTH TWICE DAILY. 180 tablet 3  . finasteride (PROSCAR) 5 MG tablet Take 5 mg by mouth daily.    . folic acid (FOLVITE) 009 MCG tablet Take 800 mcg by mouth daily.     Marland Kitchen lisinopril (PRINIVIL,ZESTRIL) 10 MG tablet TAKE 1 TABLET BY MOUTH EVERY DAY 90 tablet 3  . niacin 500 MG tablet Take  500 mg by mouth at bedtime.    . nitroGLYCERIN (NITROSTAT) 0.4 MG SL tablet Place 1 tablet (0.4 mg total) under the tongue every 5 (five) minutes as needed for chest pain. 25 tablet 3  . pantoprazole (PROTONIX) 40 MG tablet TAKE 1 TABLET(40 MG) BY MOUTH DAILY 90 tablet 0   No current facility-administered medications for this visit.     REVIEW OF SYSTEMS: see HPI for pertinent positives and negatives    Physical Examination  Vitals:   09/07/18 0858  BP: 110/66  Pulse: (!) 52  Resp: 18  Temp: (!) 97.4 F (36.3 C)  TempSrc: Temporal  SpO2: 97%  Weight: 212 lb (96.2 kg)  Height: 5\' 10"  (1.778 m)   Body mass index is 30.42 kg/m.   General: Obese male appears his stated age, in NAD.  HEENT:  No gross abnormalities Pulmonary: Respirations are non-labored, CTAB Abdomen: Soft and non-tender with normal pitched bowel sounds. Musculoskeletal: There are no major deformities.   Neurologic: No focal weakness or paresthesias are detected, M/S 5/5 throughout.  Skin: There are no ulcer or rashes noted. Psychiatric: The patient has normal affect. Cardiovascular: There is a regular rhythm, bradycardic (not on a beta blocker) without significant murmur appreciated.   Vascular: Vessel Right Left  Radial Palpable Palpable  Carotid  without bruit  without bruit  Aorta Not palpable N/A  Popliteal Not palpable Not palpable  PT Not Palpable Not Palpable  DP Not Palpable Not Palpable     DATA  Renal Duplex (Date: 09-07-18): Duplex Findings: +----------+--------+--------+------+--------+ MesentericPSV cm/sEDV cm/sPlaqueComments +----------+--------+--------+------+--------+ Aorta Mid    92                          +----------+--------+--------+------+--------+   +------------------+--------+--------+-------+ Right Renal ArteryPSV cm/sEDV cm/sComment +------------------+--------+--------+-------+ Proximal            136      34            +------------------+--------+--------+-------+ Distal              129      39           +------------------+--------+--------+-------+  +-----------------+--------+--------+-------+ Left Renal ArteryPSV cm/sEDV cm/sComment +-----------------+--------+--------+-------+ Proximal            77      16           +-----------------+--------+--------+-------+ Mid                136      34           +-----------------+--------+--------+-------+ Distal              83  22           +-----------------+--------+--------+-------+  +------------+--------+--------+--+-----------+--------+--------+---+ Right KidneyPSV cm/sEDV cm/sRILeft KidneyPSV cm/sEDV cm/sRI  +------------+--------+--------+--+-----------+--------+--------+---+ Upper Pole                    Upper Pole                     +------------+--------+--------+--+-----------+--------+--------+---+ Mid         29                Mid        31                  +------------+--------+--------+--+-----------+--------+--------+---+ Lower Pole                    Lower Pole                     +------------+--------+--------+--+-----------+--------+--------+---+ Hilar                         Hilar                          +------------+--------+--------+--+-----------+--------+--------+---+  +------------------+-------+------------------+-------+ Right Kidney             Left Kidney               +------------------+-------+------------------+-------+ RAR                      RAR                       +------------------+-------+------------------+-------+ RAR (manual)             RAR (manual)              +------------------+-------+------------------+-------+ Cortex                   Cortex                    +------------------+-------+------------------+-------+ Cortex thickness  2.01 mmCorex thickness   1.62 mm  +------------------+-------+------------------+-------+ Kidney length (cm)11.10  Kidney length (cm)9.99    +------------------+-------+------------------+-------+ Summary: Renal:  Right: Normal size right kidney. No evidence of right renal artery        stenosis. RRV flow present. Left:  Normal size of left kidney. No evidence of left renal artery        stenosis. LRV flow present. Unable to visualize the stent.      Medical Decision Making  Chad [name] is a 73 y.o. male who is s/p left renal artery stenting on 03/04/2017 by Dr. Trula Slade for a 90% for a left renal artery.  Bilateral renal duplex today shows normal sized kidneys and no renal artery stenosis. His blood pressure remains in good control on one antihypertensive medication.   Fortunately he does not have DM and has never used tobacco.  He takes Brilinta, a daily 81 mg ASA, and a statin.    Based on the patient's vascular studies and examination, I have offered the patient: bilateral renal duplex in 1 year.  I discussed in depth with the patient the nature of atherosclerosis, and emphasized the importance of maximal medical management including strict control of blood pressure, blood glucose, and lipid levels, antiplatelet agents, obtaining regular exercise, and cessation of smoking.  The patient is aware that without maximal medical management  the underlying atherosclerotic disease process will progress, limiting the benefit of any interventions.  Thank you for allowing [place] to participate in this patient's care.  Clemon Chambers, RN, MSN, FNP-C [facility] Office: [phone]  09/07/2018, 9:10 AM  Clinic MD:  Oneida Alar on call

## 2018-09-07 NOTE — Patient Instructions (Signed)
Renal Artery Stenosis Renal artery stenosis (RAS) is a narrowing of the artery that carries blood to the kidneys. It can affect one or both kidneys. The kidneys filter waste and extra fluid from the blood. Waste and fluid are then removed when a person passes urine. The kidneys also make an important chemical messenger (hormone) called renin. Renin helps regulate blood pressure. The first sign of RAS may be high blood pressure. Other symptoms can develop over time. What are the causes? A common cause of this condition is plaque buildup in your arteries (atherosclerosis). The plaques that cause this are made up of:  Fat.  Cholesterol.  Calcium.  Other substances. As these substances build up in your renal artery, the blood supply to your kidneys slows. The lack of blood and oxygen causes the signs and symptoms of RAS. A much less common cause of RAS is a disease called fibromuscular dysplasia. This disease causes abnormal cell growth that narrows the renal artery. It is not related to atherosclerosis. It occurs mostly in women who are 25-50 years old. It may be passed down through families.  What increases the risk? You are more likely to develop this condition if you:  Are a man who is at least 72 years old.  Are a woman who is at least 72 years old.  Have high blood pressure.  Have high cholesterol.  Are a smoker.  Abuse alcohol.  Have diabetes or prediabetes.  Are overweight or obese.  Have a family history of early heart disease. What are the signs or symptoms? RAS usually develops slowly. You may not have any signs or symptoms at first. Early signs may include:  Development of high blood pressure.  A sudden increase in existing high blood pressure.  No longer responding to medicine that used to control your blood pressure. Later signs and symptoms are due to kidney damage. They may include:  Feeling tired (fatigue).  Shortness of breath.  Swollen legs and feet.   Dry skin.  Headaches.  Muscle cramps.  Loss of appetite.  Nausea or vomiting. How is this diagnosed? This condition may be diagnosed based on:  Your symptoms and medical history. Your health care provider may suspect RAS based on changes in your blood pressure and your risk factors.  A physical exam. During the exam, your health care provider will use a stethoscope to listen for a whooshing sound (bruit) that can occur where the renal artery is blocking blood flow.  Various tests. These may include: ? Blood and urine tests to check your kidney function. ? Imaging tests of your kidneys, such as:  A test that uses sound waves to create an image of your kidneys and the blood flow to your kidneys (ultrasound).  A test in which dye is injected into one of your blood vessels so images can be taken as the dye flows through your renal arteries (angiogram). This can be done using X-rays, a CT scan (computed tomography angiogram, CTA), or a type of MRI (magnetic resonance angiogram, MRA). How is this treated? Making lifestyle changes to reduce your risk factors is the first treatment option for early RAS. If the blood flow to one of your kidneys is cut by more than half, you may need medicine to:  Lower your blood pressure. This is the main medical treatment for RAS. You may need more than one type of medicine for this. The types that work best for people with RAS are: ? ACE inhibitors. ? Angiotensin receptor   blockers.  Reduce fluid in the body (diuretics).  Lower your cholesterol (statins). If medicine is not enough to control RAS, you may need surgery. This may involve:  Threading a tube with an inflatable balloon into the renal artery to force it to open (angioplasty).  Removing plaque from inside the artery (endarterectomy). Follow these instructions at home:  Lifestyle  Make any lifestyle changes recommended by your health care provider. This may include: ? Working with a  dietitian to maintain a heart-healthy diet. This type of diet is low in saturated fat, salt, and added sugar. ? Starting an exercise program as directed by your health care provider. ? Maintaining a healthy weight. ? Quitting smoking. ? Not abusing alcohol. General instructions  Take over-the-counter and prescription medicines only as told by your health care provider.  Keep all follow-up visits as told by your health care provider. This is important. Contact a health care provider if:  Your symptoms of RAS are not getting better.  Your symptoms are changing or getting worse. Get help right away if you have:  Very bad pain in your back or abdomen.  Blood in your urine. Summary  Renal artery stenosis (RAS) is a narrowing of the artery that carries blood to the kidneys. It can affect one or both kidneys.  RAS usually develops slowly. You may not have any signs or symptoms at first, but high blood pressure that is difficult to control is a key symptom.  Making lifestyle changes to reduce your risk factors is the first treatment option for early RAS. If the blood flow to one of your kidneys is cut by more than half, you may need medicines to help manage your cholesterol and blood pressure. This information is not intended to replace advice given to you by your health care provider. Make sure you discuss any questions you have with your health care provider. Document Released: 10/24/2004 Document Revised: 02/24/2017 Document Reviewed: 02/24/2017 Elsevier Patient Education  2020 Elsevier Inc.  

## 2018-10-21 DIAGNOSIS — R69 Illness, unspecified: Secondary | ICD-10-CM | POA: Diagnosis not present

## 2018-11-28 DIAGNOSIS — Z23 Encounter for immunization: Secondary | ICD-10-CM | POA: Diagnosis not present

## 2018-12-07 DIAGNOSIS — N400 Enlarged prostate without lower urinary tract symptoms: Secondary | ICD-10-CM | POA: Diagnosis not present

## 2018-12-07 DIAGNOSIS — I2510 Atherosclerotic heart disease of native coronary artery without angina pectoris: Secondary | ICD-10-CM | POA: Diagnosis not present

## 2018-12-07 DIAGNOSIS — E785 Hyperlipidemia, unspecified: Secondary | ICD-10-CM | POA: Diagnosis not present

## 2018-12-07 DIAGNOSIS — E7800 Pure hypercholesterolemia, unspecified: Secondary | ICD-10-CM | POA: Diagnosis not present

## 2018-12-07 DIAGNOSIS — I10 Essential (primary) hypertension: Secondary | ICD-10-CM | POA: Diagnosis not present

## 2018-12-07 DIAGNOSIS — I701 Atherosclerosis of renal artery: Secondary | ICD-10-CM | POA: Diagnosis not present

## 2018-12-17 DIAGNOSIS — I701 Atherosclerosis of renal artery: Secondary | ICD-10-CM | POA: Diagnosis not present

## 2018-12-17 DIAGNOSIS — I10 Essential (primary) hypertension: Secondary | ICD-10-CM | POA: Diagnosis not present

## 2018-12-17 DIAGNOSIS — Z1159 Encounter for screening for other viral diseases: Secondary | ICD-10-CM | POA: Diagnosis not present

## 2018-12-17 DIAGNOSIS — Z1211 Encounter for screening for malignant neoplasm of colon: Secondary | ICD-10-CM | POA: Diagnosis not present

## 2018-12-17 DIAGNOSIS — I2510 Atherosclerotic heart disease of native coronary artery without angina pectoris: Secondary | ICD-10-CM | POA: Diagnosis not present

## 2018-12-17 DIAGNOSIS — Z8551 Personal history of malignant neoplasm of bladder: Secondary | ICD-10-CM | POA: Diagnosis not present

## 2018-12-17 DIAGNOSIS — Z136 Encounter for screening for cardiovascular disorders: Secondary | ICD-10-CM | POA: Diagnosis not present

## 2018-12-17 DIAGNOSIS — Z0000 Encounter for general adult medical examination without abnormal findings: Secondary | ICD-10-CM | POA: Diagnosis not present

## 2018-12-17 DIAGNOSIS — E7800 Pure hypercholesterolemia, unspecified: Secondary | ICD-10-CM | POA: Diagnosis not present

## 2018-12-17 DIAGNOSIS — K219 Gastro-esophageal reflux disease without esophagitis: Secondary | ICD-10-CM | POA: Diagnosis not present

## 2018-12-21 ENCOUNTER — Other Ambulatory Visit: Payer: Self-pay | Admitting: Family Medicine

## 2018-12-21 DIAGNOSIS — Z136 Encounter for screening for cardiovascular disorders: Secondary | ICD-10-CM

## 2018-12-24 DIAGNOSIS — R7989 Other specified abnormal findings of blood chemistry: Secondary | ICD-10-CM | POA: Diagnosis not present

## 2018-12-24 DIAGNOSIS — Z1159 Encounter for screening for other viral diseases: Secondary | ICD-10-CM | POA: Diagnosis not present

## 2018-12-24 DIAGNOSIS — Z125 Encounter for screening for malignant neoplasm of prostate: Secondary | ICD-10-CM | POA: Diagnosis not present

## 2018-12-24 DIAGNOSIS — E7800 Pure hypercholesterolemia, unspecified: Secondary | ICD-10-CM | POA: Diagnosis not present

## 2018-12-29 ENCOUNTER — Ambulatory Visit
Admission: RE | Admit: 2018-12-29 | Discharge: 2018-12-29 | Disposition: A | Discharge status: Home / Self Care | Payer: Medicare HMO | Source: Ambulatory Visit | Attending: Family Medicine | Admitting: Family Medicine

## 2018-12-29 DIAGNOSIS — Z87891 Personal history of nicotine dependence: Secondary | ICD-10-CM | POA: Diagnosis not present

## 2018-12-29 DIAGNOSIS — Z136 Encounter for screening for cardiovascular disorders: Secondary | ICD-10-CM

## 2019-01-27 ENCOUNTER — Telehealth: Payer: Self-pay

## 2019-01-28 ENCOUNTER — Other Ambulatory Visit: Payer: Self-pay

## 2019-01-28 MED ORDER — PANTOPRAZOLE SODIUM 40 MG PO TBEC: DELAYED_RELEASE_TABLET | ORAL | 3 refills | Status: DC

## 2019-01-28 NOTE — Telephone Encounter (Signed)
Ok to refill, 90 tabs, 3 refills

## 2019-03-10 DIAGNOSIS — N400 Enlarged prostate without lower urinary tract symptoms: Secondary | ICD-10-CM | POA: Diagnosis not present

## 2019-03-10 DIAGNOSIS — I701 Atherosclerosis of renal artery: Secondary | ICD-10-CM | POA: Diagnosis not present

## 2019-03-10 DIAGNOSIS — E7800 Pure hypercholesterolemia, unspecified: Secondary | ICD-10-CM | POA: Diagnosis not present

## 2019-03-10 DIAGNOSIS — E785 Hyperlipidemia, unspecified: Secondary | ICD-10-CM | POA: Diagnosis not present

## 2019-03-10 DIAGNOSIS — I2510 Atherosclerotic heart disease of native coronary artery without angina pectoris: Secondary | ICD-10-CM | POA: Diagnosis not present

## 2019-03-10 DIAGNOSIS — I10 Essential (primary) hypertension: Secondary | ICD-10-CM | POA: Diagnosis not present

## 2019-03-15 DIAGNOSIS — Z8551 Personal history of malignant neoplasm of bladder: Secondary | ICD-10-CM | POA: Diagnosis not present

## 2019-03-15 DIAGNOSIS — N400 Enlarged prostate without lower urinary tract symptoms: Secondary | ICD-10-CM | POA: Diagnosis not present

## 2019-03-22 ENCOUNTER — Ambulatory Visit: Payer: Medicare HMO

## 2019-04-09 ENCOUNTER — Ambulatory Visit: Payer: Medicare HMO

## 2019-04-10 ENCOUNTER — Other Ambulatory Visit: Payer: Self-pay | Admitting: Interventional Cardiology

## 2019-04-11 NOTE — Progress Notes (Signed)
Cardiology Office Note   Date:  04/13/2019   ID:  Chad [name], DOB 10/27/1945, MRN [id number]  PCP:  Chad Contras, MD    No chief complaint on file.  CAD  Wt Readings from Last 3 Encounters:  04/13/19 228 lb 6.4 oz (103.6 kg)  09/07/18 212 lb (96.2 kg)  04/13/18 214 lb 6.4 oz (97.3 kg)       History of Present Illness: Chad [name] is a 74 y.o. male   with known CAD. He had multiple everolimus drug eluting stents to his right coronary arteryin 2012- postdilated to 3.5-4 mm to treat a chronic total occlusion of the RCA.  He did well after this procedure.  There was some difficulty completing the procedure from the right radial approach.  He was brought back and additional stents were placed from the femoral approach.   He had an episode of chest discomfort and went to the hospital in 11/14. He ruled out for MI. He had an outpatient stress test which was negative for ischemia. He was started on Imdur then. Since that time, he has done well.   He was found to be resistant to Plavix in the past.   He had some renal insufficiency and a kidney stone, and was found to have renal artery stenosis. A renal stent was placed in Jan 2019.   Held Brent for circumcision in 2019. No issues at that time.   He lives at [employer] on the weekends, but this has reduced with the pandemic.  He moved to [place].  Still works from home.  Denies : Chest pain. Dizziness. Leg edema. Nitroglycerin use. Orthopnea. Palpitations. Paroxysmal nocturnal dyspnea. Shortness of breath. Syncope.   Past Medical History:  Diagnosis Date  . Aortic atherosclerosis ([place])   . Bladder cancer ([place]) 2011  . Coronary atherosclerosis of native coronary artery   . Diverticulosis   . History of hiatal hernia    Small  . History of kidney stones    5mm nonobstructive calculus in the interpolar, left kidney  . Hypertension   . Left hydrocele   . Left renal artery stenosis ([place])   . Mixed  hyperlipidemia   . Phimosis   . Renal insufficiency     Past Surgical History:  Procedure Laterality Date  . APPENDECTOMY    . CIRCUMCISION N/A 03/13/2018   Procedure: CIRCUMCISION ADULT;  Surgeon: Ceasar Mons, MD;  Location: [facility];  Service: Urology;  Laterality: N/A;  . heart stent  x 7  . RENAL ANGIOGRAPHY N/A 03/04/2017   Procedure: RENAL ANGIOGRAPHY;  Surgeon: Serafina Mitchell, MD;  Location: [facility];  Service: Cardiovascular;  Laterality: N/A;  . RENAL ARTERY STENT  03/04/2017  . TONSILLECTOMY    . TRANSURETHRAL RESECTION OF BLADDER TUMOR       Current Outpatient Medications  Medication Sig Dispense Refill  . aspirin EC 81 MG tablet Take 81 mg by mouth daily.    Marland Kitchen atorvastatin (LIPITOR) 80 MG tablet TAKE 1 TABLET BY MOUTH EVERY DAY 90 tablet 0  . BRILINTA 90 MG TABS tablet TAKE 1 TABLET(90 MG) BY MOUTH TWICE DAILY. 180 tablet 3  . finasteride (PROSCAR) 5 MG tablet Take 5 mg by mouth daily.    . folic acid (FOLVITE) Q000111Q MCG tablet Take 800 mcg by mouth daily.     Marland Kitchen lisinopril (PRINIVIL,ZESTRIL) 10 MG tablet TAKE 1 TABLET BY MOUTH EVERY DAY 90 tablet 3  . niacin 500  MG tablet Take 500 mg by mouth at bedtime.    . nitroGLYCERIN (NITROSTAT) 0.4 MG SL tablet Place 1 tablet (0.4 mg total) under the tongue every 5 (five) minutes as needed for chest pain. 25 tablet 3  . pantoprazole (PROTONIX) 40 MG tablet TAKE 1 TABLET(40 MG) BY MOUTH DAILY 90 tablet 3   No current facility-administered medications for this visit.    Allergies:   Penicillins    Social History:  The patient  reports that he has never smoked. He has never used smokeless tobacco. He reports current alcohol use. He reports that he does not use drugs.   Family History:  The patient's family history includes CAD in his brother, father, and mother; Heart attack in his father and mother.    ROS:  Please see the history of present illness.   Otherwise, review of systems are  positive for decreased exercise.   All other systems are reviewed and negative.    PHYSICAL EXAM: VS:  BP 122/74   Pulse 66   Ht 5\' 10"  (1.778 m)   Wt 228 lb 6.4 oz (103.6 kg)   BMI 32.77 kg/m  , BMI Body mass index is 32.77 kg/m. GEN: Well nourished, well developed, in no acute distress  HEENT: normal  Neck: no JVD, carotid bruits, or masses Cardiac: RRR; no murmurs, rubs, or gallops,no edema  Respiratory:  clear to auscultation bilaterally, normal work of breathing GI: soft, nontender, nondistended, + BS MS: no deformity or atrophy  Skin: warm and dry, no rash Neuro:  Strength and sensation are intact Psych: euthymic mood, full affect   EKG:   The ekg ordered today demonstrates NSR, no ST changes   Recent Labs: 06/15/2018: ALT 22   Lipid Panel    Component Value Date/Time   CHOL 177 06/15/2018 0819   CHOL 182 02/09/2014 0803   TRIG 179 (H) 06/15/2018 0819   TRIG 177 (H) 02/09/2014 0803   HDL 57 06/15/2018 0819   HDL 65 02/09/2014 0803   CHOLHDL 3.1 06/15/2018 0819   CHOLHDL 2.3 04/03/2015 0742   VLDL 26 04/03/2015 0742   LDLCALC 84 06/15/2018 0819   LDLCALC 82 02/09/2014 0803     Other studies Reviewed: Additional studies/ records that were reviewed today with results demonstrating: Labs from Dr. Rockwell Germany reviewed: Drawn in November 2020.  Total cholesterol 185, HDL 53, LDL 88, triglycerides 215.   ASSESSMENT AND PLAN:  1. CAD: Continue aggressive secondary prevention.  He has been on Brilinta due to prior Plavix resistance.  If there are any bleeding issues, could stop aspirin and continue Brilinta monotherapy. 2. Hyperlipidemia: He has had elevated triglycerides in the past, and were 215 in 12/2018  Will check with Pharm.D. to see if he is a candidate for Vascepa.  We have encouraged him to avoid processed foods and added sugars as well.  Increase exercise to target below. 3. Hypertensive heart disease: Continue blood pressure management.  Avoid excess salt and  processed foods in his diet.  Whole food, plant-based diet recommended. 4. Gave him the name of Dr. Laurine Blazer in [place], since he has moved there.  He does not mind coming to [facility] but I think it is wise that he has someone closer to home.   Current medicines are reviewed at length with the patient today.  The patient concerns regarding his medicines were addressed.  The following changes have been made:  No change  Labs/ tests ordered today include:  No orders of  the defined types were placed in this encounter.   Recommend 150 minutes/week of aerobic exercise Low fat, low carb, high fiber diet recommended  Disposition:   FU in 1 year   Signed, Larae Grooms, MD  04/13/2019 8:15 AM    [facility] [place], [place], [place]  [address] Phone: [phone]; Fax: [phone]

## 2019-04-13 ENCOUNTER — Other Ambulatory Visit: Payer: Self-pay

## 2019-04-13 ENCOUNTER — Ambulatory Visit: Payer: Medicare HMO | Admitting: Interventional Cardiology

## 2019-04-13 ENCOUNTER — Encounter: Payer: Self-pay | Admitting: Interventional Cardiology

## 2019-04-13 VITALS — BP 122/74 | HR 66 | Ht 70.0 in | Wt 228.4 lb

## 2019-04-13 DIAGNOSIS — I2510 Atherosclerotic heart disease of native coronary artery without angina pectoris: Secondary | ICD-10-CM

## 2019-04-13 DIAGNOSIS — I119 Hypertensive heart disease without heart failure: Secondary | ICD-10-CM | POA: Diagnosis not present

## 2019-04-13 DIAGNOSIS — E782 Mixed hyperlipidemia: Secondary | ICD-10-CM | POA: Diagnosis not present

## 2019-04-13 DIAGNOSIS — I701 Atherosclerosis of renal artery: Secondary | ICD-10-CM | POA: Diagnosis not present

## 2019-04-13 NOTE — Progress Notes (Signed)
[place],  I think the patient would have to decide.  Please inform Mr. Skillin, to see if he would want to pursue Vascepa given the potentional cost.  Thanks.  JV

## 2019-04-13 NOTE — Patient Instructions (Signed)
Medication Instructions:  Your physician recommends that you continue on your current medications as directed. Please refer to the Current Medication list given to you today.  *If you need a refill on your cardiac medications before your next appointment, please call your pharmacy*   Lab Work: None ordered  If you have labs (blood work) drawn today and your tests are completely normal, you will receive your results only by: Marland Kitchen MyChart Message (if you have MyChart) OR . A paper copy in the mail If you have any lab test that is abnormal or we need to change your treatment, we will call you to review the results.   Testing/Procedures: None ordered   Follow-Up: At [facility], you and your health needs are our priority.  As part of our continuing mission to provide you with exceptional heart care, we have created designated Provider Care Teams.  These Care Teams include your primary Cardiologist (physician) and Advanced Practice Providers (APPs -  Physician Assistants and Nurse Practitioners) who all work together to provide you with the care you need, when you need it.  We recommend signing up for the patient portal called "MyChart".  Sign up information is provided on this After Visit Summary.  MyChart is used to connect with patients for Virtual Visits (Telemedicine).  Patients are able to view lab/test results, encounter notes, upcoming appointments, etc.  Non-urgent messages can be sent to your provider as well.   To learn more about what you can do with MyChart, go to [email or web address].    Your next appointment:   12 month(s)  The format for your next appointment:   In Person  Provider:   You may see Larae Grooms, MD or one of the following Advanced Practice Providers on your designated Care Team:    Melina Copa, PA-C  Ermalinda Barrios, PA-C    Other Instructions Sherlie Ban, MD at [facility]

## 2019-04-16 ENCOUNTER — Telehealth: Payer: Self-pay

## 2019-04-16 DIAGNOSIS — E782 Mixed hyperlipidemia: Secondary | ICD-10-CM

## 2019-04-16 NOTE — Telephone Encounter (Signed)
Left message for patient to call back  

## 2019-04-16 NOTE — Telephone Encounter (Signed)
-----   Message from Jettie Booze, MD sent at 04/13/2019 12:45 PM EST -----   ----- Message ----- From: Ramond Dial, [facility] Sent: 04/13/2019   8:49 AM EST To: Jettie Booze, MD  Yes, he meets the enrollment criteria of the REDUCE IT trial. I checked with his insurance and its available without a prior authorization. It does look like its $100/ month though. The generic had been on backorder. Could try to get patient grant for copay coverage if he qualifies. ----- Message ----- From: Jettie Booze, MD Sent: 04/13/2019   8:39 AM EST To: Ramond Dial, RPH  WOuld he be a candidate for Vascepa.  Triglycerides were 215 in November 2020.  Thanks.

## 2019-04-16 NOTE — Telephone Encounter (Signed)
Author: Jettie Booze, MD Service: Cardiology Author Type: Physician  Filed: 04/13/2019 12:46 PM Encounter Date: 04/13/2019 Status: Signed  Editor: Jettie Booze, MD (Physician)      [place],  I think the patient would have to decide.  Please inform Mr. Amor, to see if he would want to pursue Vascepa given the potentional cost.  Thanks.  JV

## 2019-04-21 MED ORDER — ICOSAPENT ETHYL 1 G PO CAPS: 2.0000 g | ORAL_CAPSULE | Freq: Two times a day (BID) | ORAL | 1 refills | Status: DC

## 2019-04-21 NOTE — Telephone Encounter (Signed)
Called and spoke to the patient. He states that he is okay to pay $100/mo for the vascepa and wants to start it. Rx sent to preferred pharmacy for 90 day supply per patient's request. Patient will return for LIPIDS on 6/10. Discussed with Jinny Blossom, PharmD and there is nothing special that we need to do when sending the med in as long as patient is okay with paying $100/mo.

## 2019-04-21 NOTE — Addendum Note (Signed)
Addended by: Drue Novel I on: 04/21/2019 11:55 AM   Modules accepted: Orders

## 2019-04-24 ENCOUNTER — Other Ambulatory Visit: Payer: Self-pay | Admitting: Interventional Cardiology

## 2019-05-18 ENCOUNTER — Other Ambulatory Visit: Payer: Self-pay | Admitting: Interventional Cardiology

## 2019-05-26 DIAGNOSIS — R69 Illness, unspecified: Secondary | ICD-10-CM | POA: Diagnosis not present

## 2019-06-11 DIAGNOSIS — I701 Atherosclerosis of renal artery: Secondary | ICD-10-CM | POA: Diagnosis not present

## 2019-06-11 DIAGNOSIS — I2510 Atherosclerotic heart disease of native coronary artery without angina pectoris: Secondary | ICD-10-CM | POA: Diagnosis not present

## 2019-06-11 DIAGNOSIS — E785 Hyperlipidemia, unspecified: Secondary | ICD-10-CM | POA: Diagnosis not present

## 2019-06-11 DIAGNOSIS — E7800 Pure hypercholesterolemia, unspecified: Secondary | ICD-10-CM | POA: Diagnosis not present

## 2019-06-11 DIAGNOSIS — N400 Enlarged prostate without lower urinary tract symptoms: Secondary | ICD-10-CM | POA: Diagnosis not present

## 2019-06-11 DIAGNOSIS — I10 Essential (primary) hypertension: Secondary | ICD-10-CM | POA: Diagnosis not present

## 2019-06-22 DIAGNOSIS — S99911A Unspecified injury of right ankle, initial encounter: Secondary | ICD-10-CM | POA: Diagnosis not present

## 2019-06-22 DIAGNOSIS — M25571 Pain in right ankle and joints of right foot: Secondary | ICD-10-CM | POA: Diagnosis not present

## 2019-06-25 ENCOUNTER — Telehealth: Payer: Self-pay | Admitting: Interventional Cardiology

## 2019-06-25 NOTE — Telephone Encounter (Signed)
   Pt c/o medication issue:  1. Name of Medication:   icosapent Ethyl (VASCEPA) 1 g capsule     2. How are you currently taking this medication (dosage and times per day)? Take 2 capsules (2 g total) by mouth 2 (two) times daily.  3. Are you having a reaction (difficulty breathing--STAT)?   4. What is your medication issue? Pt said he is discontinuing taking this med, he said one of the side effect is gout and he just experience his first gout  Please advise

## 2019-06-25 NOTE — Telephone Encounter (Signed)
Vascepa may cause gout but not very common. Will recommend to stop Vascepa and start fenofibrate 54mg  every PM

## 2019-06-25 NOTE — Telephone Encounter (Signed)
OK to stop Vascepa and start Fenofibrate as noted.   JV

## 2019-06-25 NOTE — Telephone Encounter (Signed)
Gout and Vascepa? Any substitutions?

## 2019-06-25 NOTE — Telephone Encounter (Signed)
Spoke with pt who states he has been diagnosed with gout this week.  Pt states he did some research on his new medication Vascepa and found one of the side effects can be gout.  Pt states he has stopped medication.  Pt advised will forward information to Dr Irish Lack and his RN for review and any further recommendation.  Pt verbalizes understanding and agrees with current plan.

## 2019-06-28 DIAGNOSIS — R946 Abnormal results of thyroid function studies: Secondary | ICD-10-CM | POA: Diagnosis not present

## 2019-06-28 DIAGNOSIS — R7989 Other specified abnormal findings of blood chemistry: Secondary | ICD-10-CM | POA: Diagnosis not present

## 2019-06-28 DIAGNOSIS — M109 Gout, unspecified: Secondary | ICD-10-CM | POA: Diagnosis not present

## 2019-06-28 MED ORDER — FENOFIBRATE 54 MG PO TABS: 54.0000 mg | ORAL_TABLET | Freq: Every day | ORAL | 3 refills | Status: DC

## 2019-06-28 NOTE — Telephone Encounter (Signed)
Called and spoke to patient. Instructed patient to stop vascepa and start fenofibrate 54 mg QD in the PM. Patient verbalized understanding. Rx sent to preferred pharmacy.

## 2019-07-06 ENCOUNTER — Telehealth: Payer: Self-pay | Admitting: Interventional Cardiology

## 2019-07-06 DIAGNOSIS — M109 Gout, unspecified: Secondary | ICD-10-CM

## 2019-07-06 NOTE — Telephone Encounter (Signed)
[facility] is calling requesting a uric acid test be added to his lab work scheduled for 07/22/19. Please advise.

## 2019-07-07 NOTE — Telephone Encounter (Signed)
Returned call to patient. He states that when he was on the vascepa that he had a gout flare up and uric acid became elevated. He would like for his uric acid be checked when he comes for lipids on 6/10. Made patient aware that I will forward to Dr. Irish Lack for approval, but that should be fine. He verbalized understanding.

## 2019-07-08 NOTE — Telephone Encounter (Signed)
OK to check uric acid.  JV

## 2019-07-09 ENCOUNTER — Other Ambulatory Visit: Payer: Self-pay | Admitting: Interventional Cardiology

## 2019-07-09 NOTE — Telephone Encounter (Signed)
Called and made patient aware that Dr. Irish Lack was okay with adding uric acid to his labs on 6/10.

## 2019-07-22 ENCOUNTER — Other Ambulatory Visit: Payer: Medicare HMO | Admitting: *Deleted

## 2019-07-22 ENCOUNTER — Other Ambulatory Visit: Payer: Self-pay

## 2019-07-22 DIAGNOSIS — E782 Mixed hyperlipidemia: Secondary | ICD-10-CM

## 2019-07-22 DIAGNOSIS — M109 Gout, unspecified: Secondary | ICD-10-CM

## 2019-07-22 LAB — LIPID PANEL
Chol/HDL Ratio: 3.2 ratio (ref 0.0–5.0)
Cholesterol, Total: 178 mg/dL (ref 100–199)
HDL: 56 mg/dL (ref >39)
LDL Chol Calc (NIH): 95 mg/dL (ref 0–99)
Triglycerides: 156 mg/dL — ABNORMAL HIGH (ref 0–149)
VLDL Cholesterol Cal: 27 mg/dL (ref 5–40)

## 2019-07-22 LAB — URIC ACID: Uric Acid: 7.2 mg/dL (ref 3.8–8.4)

## 2019-09-14 DIAGNOSIS — E785 Hyperlipidemia, unspecified: Secondary | ICD-10-CM | POA: Diagnosis not present

## 2019-09-14 DIAGNOSIS — N400 Enlarged prostate without lower urinary tract symptoms: Secondary | ICD-10-CM | POA: Diagnosis not present

## 2019-09-14 DIAGNOSIS — E7800 Pure hypercholesterolemia, unspecified: Secondary | ICD-10-CM | POA: Diagnosis not present

## 2019-09-14 DIAGNOSIS — I10 Essential (primary) hypertension: Secondary | ICD-10-CM | POA: Diagnosis not present

## 2019-09-14 DIAGNOSIS — I2510 Atherosclerotic heart disease of native coronary artery without angina pectoris: Secondary | ICD-10-CM | POA: Diagnosis not present

## 2019-09-14 DIAGNOSIS — I701 Atherosclerosis of renal artery: Secondary | ICD-10-CM | POA: Diagnosis not present

## 2019-11-15 DIAGNOSIS — Z23 Encounter for immunization: Secondary | ICD-10-CM | POA: Diagnosis not present

## 2019-11-16 DIAGNOSIS — R69 Illness, unspecified: Secondary | ICD-10-CM | POA: Diagnosis not present

## 2019-12-31 DIAGNOSIS — I2510 Atherosclerotic heart disease of native coronary artery without angina pectoris: Secondary | ICD-10-CM | POA: Diagnosis not present

## 2019-12-31 DIAGNOSIS — I10 Essential (primary) hypertension: Secondary | ICD-10-CM | POA: Diagnosis not present

## 2019-12-31 DIAGNOSIS — K219 Gastro-esophageal reflux disease without esophagitis: Secondary | ICD-10-CM | POA: Diagnosis not present

## 2019-12-31 DIAGNOSIS — I701 Atherosclerosis of renal artery: Secondary | ICD-10-CM | POA: Diagnosis not present

## 2019-12-31 DIAGNOSIS — N400 Enlarged prostate without lower urinary tract symptoms: Secondary | ICD-10-CM | POA: Diagnosis not present

## 2019-12-31 DIAGNOSIS — E785 Hyperlipidemia, unspecified: Secondary | ICD-10-CM | POA: Diagnosis not present

## 2019-12-31 DIAGNOSIS — E7800 Pure hypercholesterolemia, unspecified: Secondary | ICD-10-CM | POA: Diagnosis not present

## 2020-02-10 DIAGNOSIS — I10 Essential (primary) hypertension: Secondary | ICD-10-CM | POA: Diagnosis not present

## 2020-02-10 DIAGNOSIS — I701 Atherosclerosis of renal artery: Secondary | ICD-10-CM | POA: Diagnosis not present

## 2020-02-10 DIAGNOSIS — N400 Enlarged prostate without lower urinary tract symptoms: Secondary | ICD-10-CM | POA: Diagnosis not present

## 2020-02-10 DIAGNOSIS — K219 Gastro-esophageal reflux disease without esophagitis: Secondary | ICD-10-CM | POA: Diagnosis not present

## 2020-02-10 DIAGNOSIS — E7800 Pure hypercholesterolemia, unspecified: Secondary | ICD-10-CM | POA: Diagnosis not present

## 2020-02-10 DIAGNOSIS — I2510 Atherosclerotic heart disease of native coronary artery without angina pectoris: Secondary | ICD-10-CM | POA: Diagnosis not present

## 2020-02-10 DIAGNOSIS — E785 Hyperlipidemia, unspecified: Secondary | ICD-10-CM | POA: Diagnosis not present

## 2020-02-25 DIAGNOSIS — M109 Gout, unspecified: Secondary | ICD-10-CM | POA: Diagnosis not present

## 2020-02-25 DIAGNOSIS — K219 Gastro-esophageal reflux disease without esophagitis: Secondary | ICD-10-CM | POA: Diagnosis not present

## 2020-02-25 DIAGNOSIS — R7989 Other specified abnormal findings of blood chemistry: Secondary | ICD-10-CM | POA: Diagnosis not present

## 2020-02-25 DIAGNOSIS — N1831 Chronic kidney disease, stage 3a: Secondary | ICD-10-CM | POA: Diagnosis not present

## 2020-02-25 DIAGNOSIS — Z1389 Encounter for screening for other disorder: Secondary | ICD-10-CM | POA: Diagnosis not present

## 2020-02-25 DIAGNOSIS — E7800 Pure hypercholesterolemia, unspecified: Secondary | ICD-10-CM | POA: Diagnosis not present

## 2020-02-25 DIAGNOSIS — R946 Abnormal results of thyroid function studies: Secondary | ICD-10-CM | POA: Diagnosis not present

## 2020-02-25 DIAGNOSIS — I10 Essential (primary) hypertension: Secondary | ICD-10-CM | POA: Diagnosis not present

## 2020-02-25 DIAGNOSIS — Z0000 Encounter for general adult medical examination without abnormal findings: Secondary | ICD-10-CM | POA: Diagnosis not present

## 2020-02-25 DIAGNOSIS — Z8551 Personal history of malignant neoplasm of bladder: Secondary | ICD-10-CM | POA: Diagnosis not present

## 2020-02-25 DIAGNOSIS — I2510 Atherosclerotic heart disease of native coronary artery without angina pectoris: Secondary | ICD-10-CM | POA: Diagnosis not present

## 2020-02-25 DIAGNOSIS — I701 Atherosclerosis of renal artery: Secondary | ICD-10-CM | POA: Diagnosis not present

## 2020-02-25 DIAGNOSIS — Z125 Encounter for screening for malignant neoplasm of prostate: Secondary | ICD-10-CM | POA: Diagnosis not present

## 2020-03-16 DIAGNOSIS — Z8551 Personal history of malignant neoplasm of bladder: Secondary | ICD-10-CM | POA: Diagnosis not present

## 2020-03-16 DIAGNOSIS — N400 Enlarged prostate without lower urinary tract symptoms: Secondary | ICD-10-CM | POA: Diagnosis not present

## 2020-03-16 DIAGNOSIS — N1831 Chronic kidney disease, stage 3a: Secondary | ICD-10-CM | POA: Diagnosis not present

## 2020-04-11 NOTE — Progress Notes (Unsigned)
Cardiology Office Note   Date:  04/13/2020   ID:  Chad [name], DOB [birth year], MRN [id number]  PCP:  Antony Contras, MD    No chief complaint on file.  CAD  Wt Readings from Last 3 Encounters:  04/13/20 220 lb 12.8 oz (100.2 kg)  04/13/19 228 lb 6.4 oz (103.6 kg)  09/07/18 212 lb (96.2 kg)       History of Present Illness: Chad [name] is a 75 y.o. male    with known CAD. He had multiple everolimus drug eluting stents to his right coronary arteryin 2012- postdilated to 3.5-4 mm to treat a chronic total occlusion of the RCA.  He did well after this procedure.  There was some difficulty completing the procedure from the right radial approach.  He was brought back and additional stents were placed from the femoral approach.   He had an episode of chest discomfort and went to the hospital in 11/14. He ruled out for MI. He had an outpatient stress test which was negative for ischemia. He was started on Imdur then. Since that time, he has done well.  He was found to be resistant to Plavix in the past.  He had some renal insufficiencyand a kidney stone,and was found to have renal artery stenosis. A renal stent was placed in Jan 2019.  Held [place] for circumcision in 2019. No issues at that time.   He lives at [employer] on the weekends, but this has reduced with the pandemic.  He moved to [place] in 2020.  Still works from home.  "Gave him the name of Dr. Laurine Blazer in [place], since he has moved there.  He does not mind coming to [facility] but I think it is wise that he has someone closer to home."  Denies : Chest pain. Dizziness. Leg edema. Nitroglycerin use. Orthopnea. Palpitations. Paroxysmal nocturnal dyspnea. Shortness of breath. Syncope.   He has had some gout that has limited walking  He has avoided COVID.  He got all of his vaccinations.   Still working part time in [place], mostly remote.   Past Medical History:  Diagnosis Date  . Aortic  atherosclerosis ([place])   . Bladder cancer ([place]) 2011  . Coronary atherosclerosis of native coronary artery   . Diverticulosis   . History of hiatal hernia    Small  . History of kidney stones    48mm nonobstructive calculus in the interpolar, left kidney  . Hypertension   . Left hydrocele   . Left renal artery stenosis ([place])   . Mixed hyperlipidemia   . Phimosis   . Renal insufficiency     Past Surgical History:  Procedure Laterality Date  . APPENDECTOMY    . CIRCUMCISION N/A 03/13/2018   Procedure: CIRCUMCISION ADULT;  Surgeon: Ceasar Mons, MD;  Location: [facility];  Service: Urology;  Laterality: N/A;  . heart stent  x 7  . RENAL ANGIOGRAPHY N/A 03/04/2017   Procedure: RENAL ANGIOGRAPHY;  Surgeon: Serafina Mitchell, MD;  Location: [facility];  Service: Cardiovascular;  Laterality: N/A;  . RENAL ARTERY STENT  03/04/2017  . TONSILLECTOMY    . TRANSURETHRAL RESECTION OF BLADDER TUMOR       Current Outpatient Medications  Medication Sig Dispense Refill  . aspirin EC 81 MG tablet Take 81 mg by mouth daily.    Marland Kitchen atorvastatin (LIPITOR) 80 MG tablet TAKE 1 TABLET BY MOUTH EVERY DAY 90 tablet 2  . BRILINTA  90 MG TABS tablet TAKE 1 TABLET(90 MG) BY MOUTH TWICE DAILY. 180 tablet 3  . fenofibrate 54 MG tablet Take 1 tablet (54 mg total) by mouth daily. 90 tablet 3  . finasteride (PROSCAR) 5 MG tablet Take 5 mg by mouth daily.    . folic acid (FOLVITE) 751 MCG tablet Take 800 mcg by mouth daily.     Marland Kitchen lisinopril (ZESTRIL) 10 MG tablet TAKE 1 TABLET BY MOUTH EVERY DAY 90 tablet 3  . niacin 500 MG tablet Take 500 mg by mouth at bedtime.    . nitroGLYCERIN (NITROSTAT) 0.4 MG SL tablet Place 1 tablet (0.4 mg total) under the tongue every 5 (five) minutes as needed for chest pain. 25 tablet 3  . pantoprazole (PROTONIX) 40 MG tablet TAKE 1 TABLET(40 MG) BY MOUTH DAILY 90 tablet 3   No current facility-administered medications for this visit.     Allergies:   Penicillins    Social History:  The patient  reports that he has never smoked. He has never used smokeless tobacco. He reports current alcohol use. He reports that he does not use drugs.   Family History:  The patient's family history includes CAD in his brother, father, and mother; Heart attack in his father and mother.    ROS:  Please see the history of present illness.   Otherwise, review of systems are positive for foot pain.   All other systems are reviewed and negative.    PHYSICAL EXAM: VS:  BP 116/72   Pulse (!) 55   Ht 5\' 10"  (1.778 m)   Wt 220 lb 12.8 oz (100.2 kg)   SpO2 94%   BMI 31.68 kg/m  , BMI Body mass index is 31.68 kg/m. GEN: Well nourished, well developed, in no acute distress  HEENT: normal  Neck: no JVD, carotid bruits, or masses Cardiac: RRR; no murmurs, rubs, or gallops,no edema  Respiratory:  clear to auscultation bilaterally, normal work of breathing GI: soft, nontender, nondistended, + BS MS: no deformity or atrophy  Skin: warm and dry, no rash Neuro:  Strength and sensation are intact Psych: euthymic mood, full affect   EKG:   The ekg ordered today demonstrates sinus bradycardia, no ST changes   Recent Labs: No results found for requested labs within last 8760 hours.   Lipid Panel    Component Value Date/Time   CHOL 178 07/22/2019 1053   CHOL 182 02/09/2014 0803   TRIG 156 (H) 07/22/2019 1053   TRIG 177 (H) 02/09/2014 0803   HDL 56 07/22/2019 1053   HDL 65 02/09/2014 0803   CHOLHDL 3.2 07/22/2019 1053   CHOLHDL 2.3 04/03/2015 0742   VLDL 26 04/03/2015 0742   LDLCALC 95 07/22/2019 1053   LDLCALC 82 02/09/2014 0803     Other studies Reviewed: Additional studies/ records that were reviewed today with results demonstrating: labs reviewed. LDL 77 in 2022.  Cr 1.74.    ASSESSMENT AND PLAN:  1. CAD: Continue aggressive secondary prevention.  No angina. No bleeding issues.  2. Hyperlipidemia: Whole food, plant-based  diet recommended. 3. Hypertensive heart disease: Low-salt diet.  Avoid processed food.  Continue regular exercise.  His diet for gout is heart healthy.   4. Obesity: Dietary instructions.  Try to find a stationary bike.   5. CKD: Avoid nephrotoxins.  Avoid NSAIDs.  May take NSAIDs for gout, 2-3 days at a time.  Stay hydrated.  On protonix, but has severe sx if protonix is stopped.  We discussed  that these meds are associated with CKD.     Current medicines are reviewed at length with the patient today.  The patient concerns regarding his medicines were addressed.  The following changes have been made:  No change  Labs/ tests ordered today include:  No orders of the defined types were placed in this encounter.   Recommend 150 minutes/week of aerobic exercise Low fat, low carb, high fiber diet recommended  Disposition:   FU in 1 year   Signed, Larae Grooms, MD  04/13/2020 9:38 AM    [facility] [address], [place], [place]  [address] Phone: [phone]; Fax: [phone]

## 2020-04-13 ENCOUNTER — Other Ambulatory Visit: Payer: Self-pay

## 2020-04-13 ENCOUNTER — Ambulatory Visit: Payer: Medicare HMO | Admitting: Interventional Cardiology

## 2020-04-13 ENCOUNTER — Encounter: Payer: Self-pay | Admitting: Interventional Cardiology

## 2020-04-13 VITALS — BP 116/72 | HR 55 | Ht 70.0 in | Wt 220.8 lb

## 2020-04-13 DIAGNOSIS — I701 Atherosclerosis of renal artery: Secondary | ICD-10-CM

## 2020-04-13 DIAGNOSIS — I722 Aneurysm of renal artery: Secondary | ICD-10-CM | POA: Diagnosis not present

## 2020-04-13 DIAGNOSIS — I119 Hypertensive heart disease without heart failure: Secondary | ICD-10-CM | POA: Diagnosis not present

## 2020-04-13 DIAGNOSIS — E7800 Pure hypercholesterolemia, unspecified: Secondary | ICD-10-CM

## 2020-04-13 DIAGNOSIS — I2510 Atherosclerotic heart disease of native coronary artery without angina pectoris: Secondary | ICD-10-CM

## 2020-04-13 NOTE — Patient Instructions (Signed)
Medication Instructions:  Your physician recommends that you continue on your current medications as directed. Please refer to the Current Medication list given to you today.  *If you need a refill on your cardiac medications before your next appointment, please call your pharmacy*   Lab Work: one If you have labs (blood work) drawn today and your tests are completely normal, you will receive your results only by: Marland Kitchen MyChart Message (if you have MyChart) OR . A paper copy in the mail If you have any lab test that is abnormal or we need to change your treatment, we will call you to review the results.   Testing/Procedures: none   Follow-Up: At [facility], you and your health needs are our priority.  As part of our continuing mission to provide you with exceptional heart care, we have created designated Provider Care Teams.  These Care Teams include your primary Cardiologist (physician) and Advanced Practice Providers (APPs -  Physician Assistants and Nurse Practitioners) who all work together to provide you with the care you need, when you need it.  We recommend signing up for the patient portal called "MyChart".  Sign up information is provided on this After Visit Summary.  MyChart is used to connect with patients for Virtual Visits (Telemedicine).  Patients are able to view lab/test results, encounter notes, upcoming appointments, etc.  Non-urgent messages can be sent to your provider as well.   To learn more about what you can do with MyChart, go to [email or web address].    Your next appointment:   12 month(s)  The format for your next appointment:   In Person  Provider:   You may see Larae Grooms, MD or one of the following Advanced Practice Providers on your designated Care Team:    Melina Copa, PA-C  Ermalinda Barrios, PA-C    Other Instructions  High-Fiber Eating Plan Fiber, also called dietary fiber, is a type of carbohydrate. It is found foods such as fruits,  vegetables, whole grains, and beans. A high-fiber diet can have many health benefits. Your health care provider may recommend a high-fiber diet to help:  Prevent constipation. Fiber can make your bowel movements more regular.  Lower your cholesterol.  Relieve the following conditions: ? Inflammation of veins in the anus (hemorrhoids). ? Inflammation of specific areas of the digestive tract (uncomplicated diverticulosis). ? A problem of the large intestine, also called the colon, that sometimes causes pain and diarrhea (irritable bowel syndrome, or IBS).  Prevent overeating as part of a weight-loss plan.  Prevent heart disease, type 2 diabetes, and certain cancers. What are tips for following this plan? Reading food labels  Check the nutrition facts label on food products for the amount of dietary fiber. Choose foods that have 5 grams of fiber or more per serving.  The goals for recommended daily fiber intake include: ? Men (age 58 or younger): 34-38 g. ? Men (over age 38): 28-34 g. ? Women (age 40 or younger): 25-28 g. ? Women (over age 36): 22-25 g. Your daily fiber goal is _____________ g.   Shopping  Choose whole fruits and vegetables instead of processed forms, such as apple juice or applesauce.  Choose a wide variety of high-fiber foods such as avocados, lentils, oats, and kidney beans.  Read the nutrition facts label of the foods you choose. Be aware of foods with added fiber. These foods often have high sugar and sodium amounts per serving. Cooking  Use whole-grain flour for baking and cooking.  Cook with brown rice instead of white rice. Meal planning  Start the day with a breakfast that is high in fiber, such as a cereal that contains 5 g of fiber or more per serving.  Eat breads and cereals that are made with whole-grain flour instead of refined flour or white flour.  Eat brown rice, bulgur wheat, or millet instead of white rice.  Use beans in place of meat in  soups, salads, and pasta dishes.  Be sure that half of the grains you eat each day are whole grains. General information  You can get the recommended daily intake of dietary fiber by: ? Eating a variety of fruits, vegetables, grains, nuts, and beans. ? Taking a fiber supplement if you are not able to take in enough fiber in your diet. It is better to get fiber through food than from a supplement.  Gradually increase how much fiber you consume. If you increase your intake of dietary fiber too quickly, you may have bloating, cramping, or gas.  Drink plenty of water to help you digest fiber.  Choose high-fiber snacks, such as berries, raw vegetables, nuts, and popcorn. What foods should I eat? Fruits Berries. Pears. Apples. Oranges. Avocado. Prunes and raisins. Dried figs. Vegetables Sweet potatoes. Spinach. Kale. Artichokes. Cabbage. Broccoli. Cauliflower. Green peas. Carrots. Squash. Grains Whole-grain breads. Multigrain cereal. Oats and oatmeal. Brown rice. Barley. Bulgur wheat. Millet. Quinoa. Bran muffins. Popcorn. Rye wafer crackers. Meats and other proteins Navy beans, kidney beans, and pinto beans. Soybeans. Split peas. Lentils. Nuts and seeds. Dairy Fiber-fortified yogurt. Beverages Fiber-fortified soy milk. Fiber-fortified orange juice. Other foods Fiber bars. The items listed above may not be a complete list of recommended foods and beverages. Contact a dietitian for more information. What foods should I avoid? Fruits Fruit juice. Cooked, strained fruit. Vegetables Fried potatoes. Canned vegetables. Well-cooked vegetables. Grains White bread. Pasta made with refined flour. White rice. Meats and other proteins Fatty cuts of meat. Fried chicken or fried fish. Dairy Milk. Yogurt. Cream cheese. Sour cream. Fats and oils Butters. Beverages Soft drinks. Other foods Cakes and pastries. The items listed above may not be a complete list of foods and beverages to avoid.  Talk with your dietitian about what choices are best for you. Summary  Fiber is a type of carbohydrate. It is found in foods such as fruits, vegetables, whole grains, and beans.  A high-fiber diet has many benefits. It can help to prevent constipation, lower blood cholesterol, aid weight loss, and reduce your risk of heart disease, diabetes, and certain cancers.  Increase your intake of fiber gradually. Increasing fiber too quickly may cause cramping, bloating, and gas. Drink plenty of water while you increase the amount of fiber you consume.  The best sources of fiber include whole fruits and vegetables, whole grains, nuts, seeds, and beans. This information is not intended to replace advice given to you by your health care provider. Make sure you discuss any questions you have with your health care provider. Document Revised: 06/03/2019 Document Reviewed: 06/03/2019 Elsevier Patient Education  2021 Elsevier Inc.   

## 2020-04-26 ENCOUNTER — Other Ambulatory Visit: Payer: Self-pay | Admitting: Interventional Cardiology

## 2020-05-10 ENCOUNTER — Other Ambulatory Visit: Payer: Self-pay | Admitting: Interventional Cardiology

## 2020-05-11 DIAGNOSIS — I701 Atherosclerosis of renal artery: Secondary | ICD-10-CM | POA: Diagnosis not present

## 2020-05-11 DIAGNOSIS — E7800 Pure hypercholesterolemia, unspecified: Secondary | ICD-10-CM | POA: Diagnosis not present

## 2020-05-11 DIAGNOSIS — I10 Essential (primary) hypertension: Secondary | ICD-10-CM | POA: Diagnosis not present

## 2020-05-11 DIAGNOSIS — K219 Gastro-esophageal reflux disease without esophagitis: Secondary | ICD-10-CM | POA: Diagnosis not present

## 2020-05-11 DIAGNOSIS — N1831 Chronic kidney disease, stage 3a: Secondary | ICD-10-CM | POA: Diagnosis not present

## 2020-05-11 DIAGNOSIS — N400 Enlarged prostate without lower urinary tract symptoms: Secondary | ICD-10-CM | POA: Diagnosis not present

## 2020-05-11 DIAGNOSIS — E785 Hyperlipidemia, unspecified: Secondary | ICD-10-CM | POA: Diagnosis not present

## 2020-05-11 DIAGNOSIS — I2510 Atherosclerotic heart disease of native coronary artery without angina pectoris: Secondary | ICD-10-CM | POA: Diagnosis not present

## 2020-06-04 ENCOUNTER — Other Ambulatory Visit: Payer: Self-pay | Admitting: Interventional Cardiology

## 2020-06-06 ENCOUNTER — Other Ambulatory Visit: Payer: Self-pay | Admitting: Interventional Cardiology

## 2020-06-14 DIAGNOSIS — N1831 Chronic kidney disease, stage 3a: Secondary | ICD-10-CM | POA: Diagnosis not present

## 2020-07-30 IMAGING — US US ABDOMINAL AORTA SCREENING AAA
1 series · 6 of 6 positions shown · non-contrast
Comparison: None.

CLINICAL DATA: Abdominal aortic screening.

EXAM:
US ABDOMINAL AORTA MEDICARE SCREENING
TECHNIQUE: Ultrasound examination of the abdominal aorta was performed as a
screening evaluation for abdominal aortic aneurysm.

[Series 1: us abdominal aorta screening aaa · 0.36mm/px · 6 of 6 slices shown]
[im 1/6]
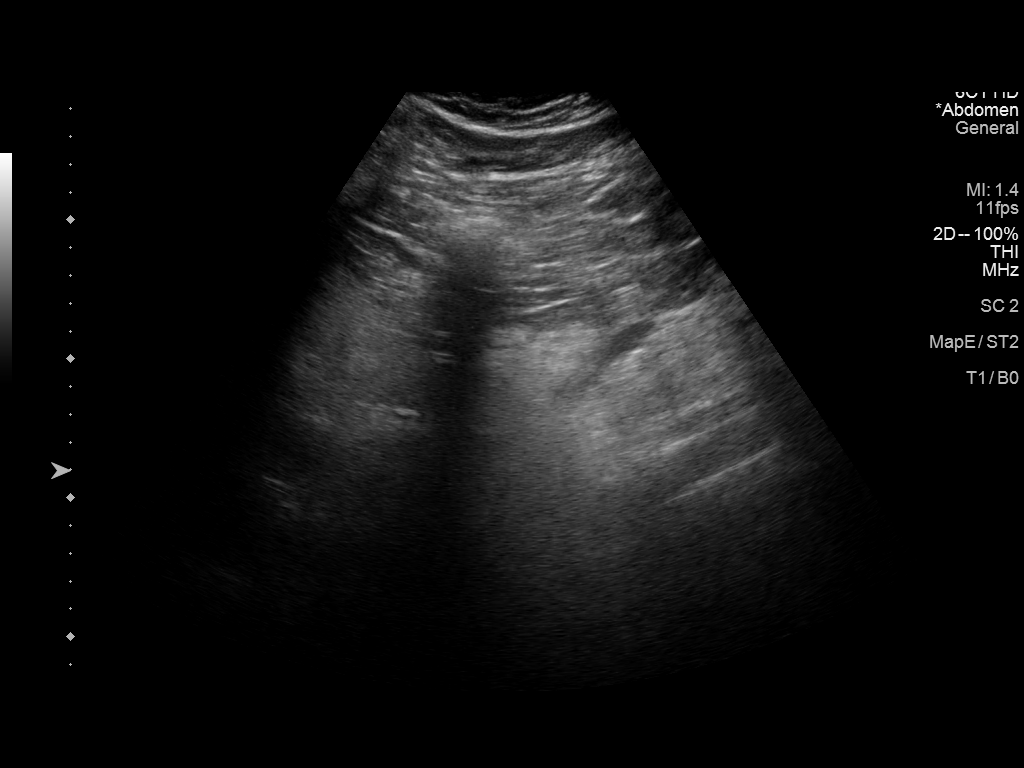
[im 2/6]
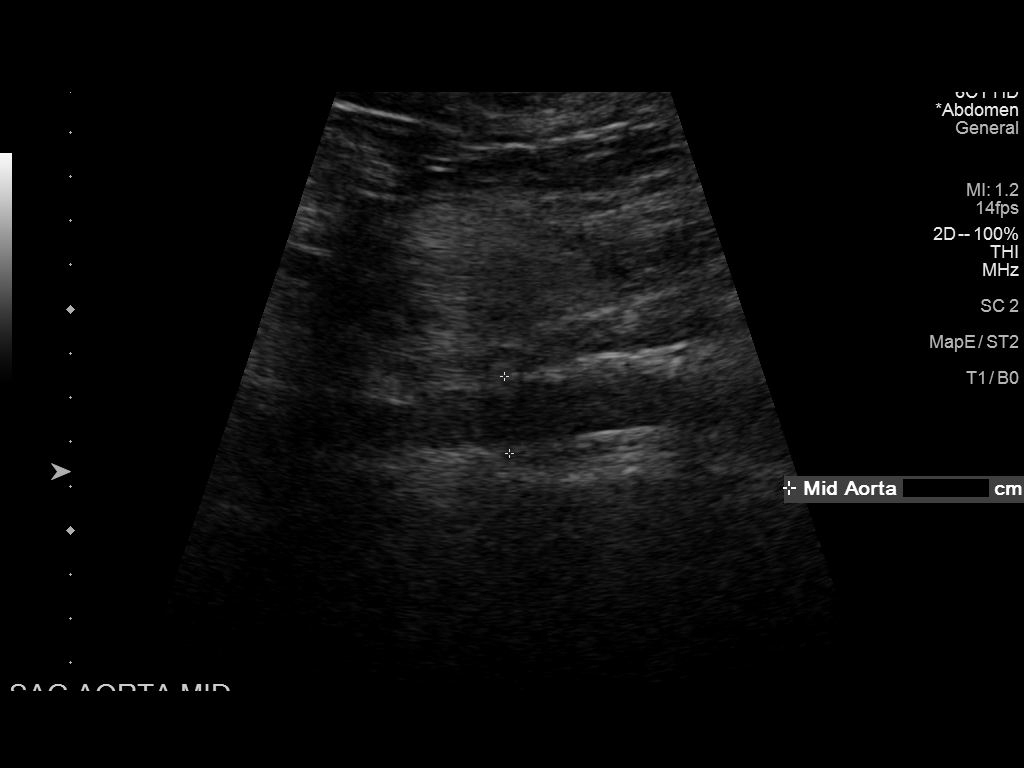
[im 3/6]
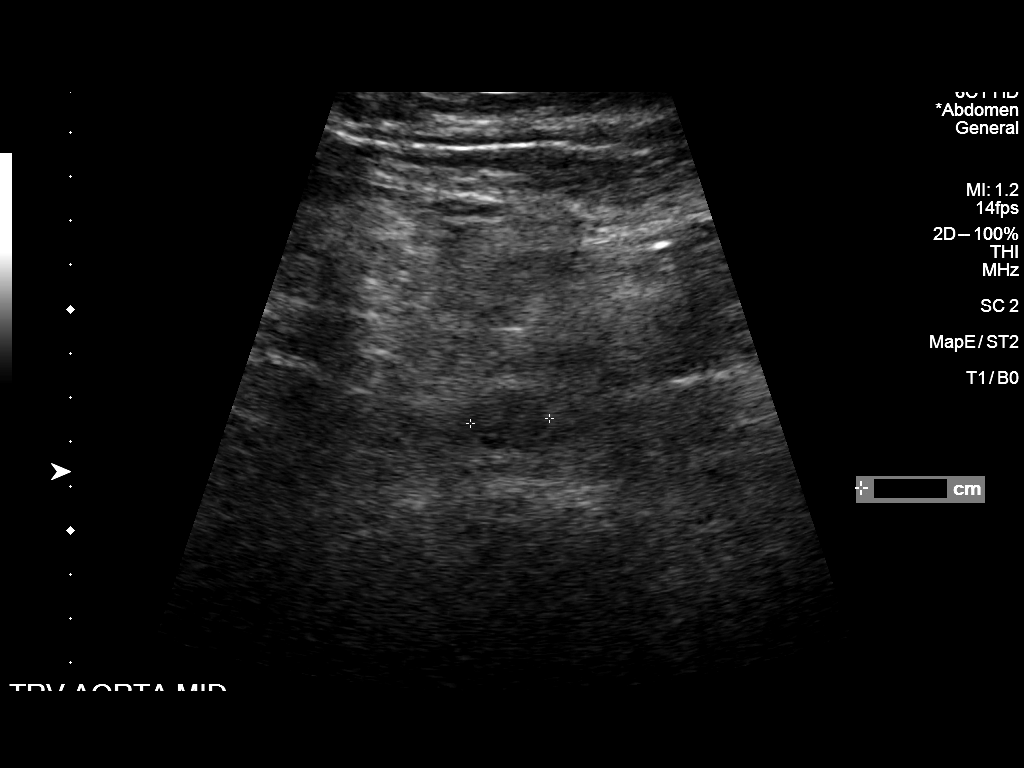
[im 4/6]
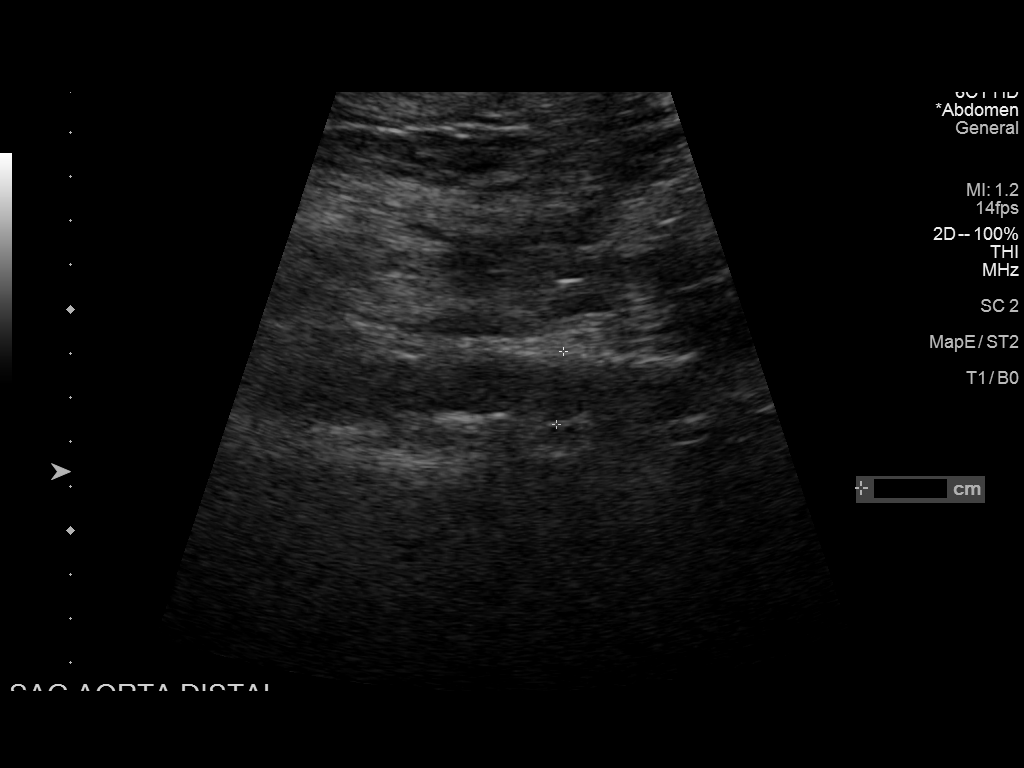
[im 5/6]
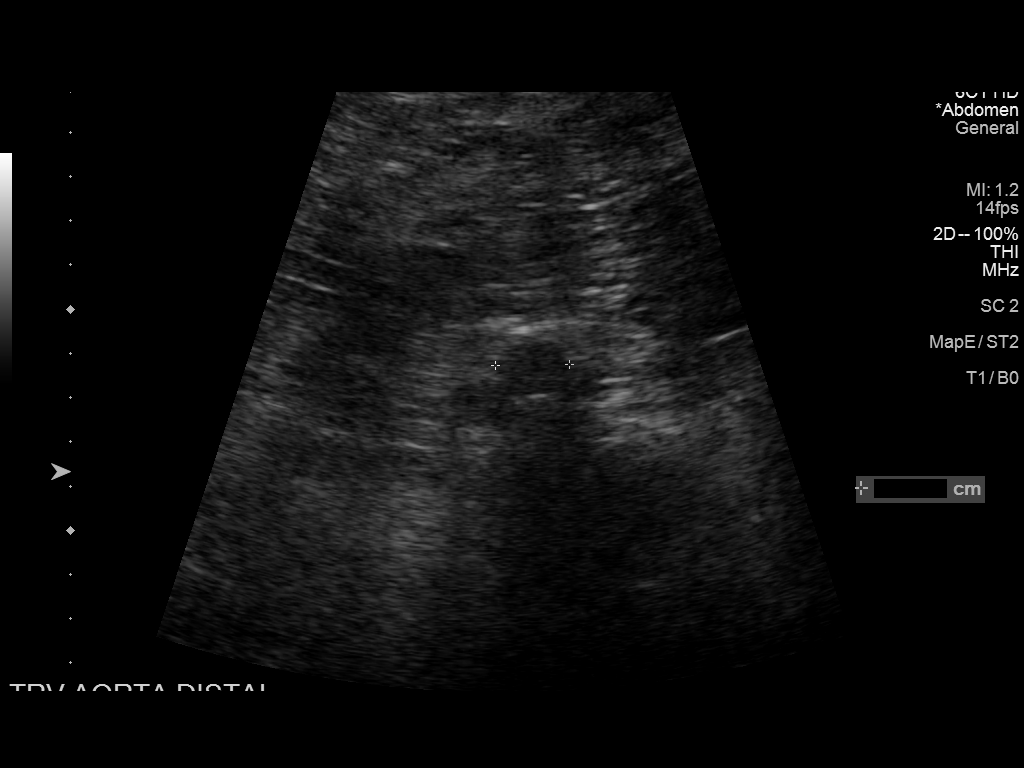
[im 6/6]
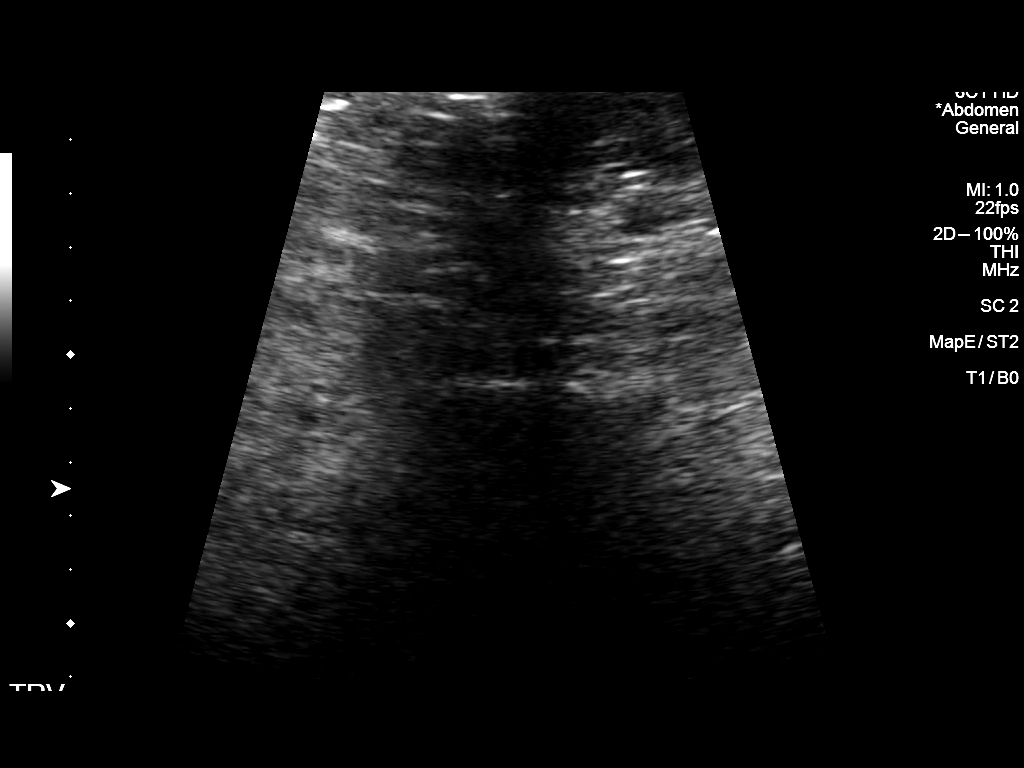

[6 of 6 positions shown; findings below may reference images not displayed]

FINDINGS: Abdominal aortic measurements as follows:

Proximal:  Not visualized.

Mid:  1.8 cm

Distal:  1.7 cm
IMPRESSION: Proximal abdominal aorta is not visualized and therefore pathology
in this area cannot be excluded. Normal caliber is seen involving
mid and distal portions of abdominal aorta. No definite aortic
aneurysm is seen in these portions of the abdominal aorta.

## 2020-09-15 DIAGNOSIS — E7800 Pure hypercholesterolemia, unspecified: Secondary | ICD-10-CM | POA: Diagnosis not present

## 2020-09-15 DIAGNOSIS — N400 Enlarged prostate without lower urinary tract symptoms: Secondary | ICD-10-CM | POA: Diagnosis not present

## 2020-09-15 DIAGNOSIS — I2510 Atherosclerotic heart disease of native coronary artery without angina pectoris: Secondary | ICD-10-CM | POA: Diagnosis not present

## 2020-09-15 DIAGNOSIS — I701 Atherosclerosis of renal artery: Secondary | ICD-10-CM | POA: Diagnosis not present

## 2020-09-15 DIAGNOSIS — N1831 Chronic kidney disease, stage 3a: Secondary | ICD-10-CM | POA: Diagnosis not present

## 2020-09-15 DIAGNOSIS — K219 Gastro-esophageal reflux disease without esophagitis: Secondary | ICD-10-CM | POA: Diagnosis not present

## 2020-09-15 DIAGNOSIS — I10 Essential (primary) hypertension: Secondary | ICD-10-CM | POA: Diagnosis not present

## 2020-09-15 DIAGNOSIS — E785 Hyperlipidemia, unspecified: Secondary | ICD-10-CM | POA: Diagnosis not present

## 2020-11-20 DIAGNOSIS — E785 Hyperlipidemia, unspecified: Secondary | ICD-10-CM | POA: Diagnosis not present

## 2020-11-20 DIAGNOSIS — N1831 Chronic kidney disease, stage 3a: Secondary | ICD-10-CM | POA: Diagnosis not present

## 2020-11-20 DIAGNOSIS — I701 Atherosclerosis of renal artery: Secondary | ICD-10-CM | POA: Diagnosis not present

## 2020-11-20 DIAGNOSIS — I10 Essential (primary) hypertension: Secondary | ICD-10-CM | POA: Diagnosis not present

## 2020-11-20 DIAGNOSIS — N400 Enlarged prostate without lower urinary tract symptoms: Secondary | ICD-10-CM | POA: Diagnosis not present

## 2020-11-20 DIAGNOSIS — E7800 Pure hypercholesterolemia, unspecified: Secondary | ICD-10-CM | POA: Diagnosis not present

## 2020-11-20 DIAGNOSIS — I2510 Atherosclerotic heart disease of native coronary artery without angina pectoris: Secondary | ICD-10-CM | POA: Diagnosis not present

## 2020-11-20 DIAGNOSIS — K219 Gastro-esophageal reflux disease without esophagitis: Secondary | ICD-10-CM | POA: Diagnosis not present

## 2021-03-23 DIAGNOSIS — Z8551 Personal history of malignant neoplasm of bladder: Secondary | ICD-10-CM | POA: Diagnosis not present

## 2021-03-23 DIAGNOSIS — N400 Enlarged prostate without lower urinary tract symptoms: Secondary | ICD-10-CM | POA: Diagnosis not present

## 2021-03-26 DIAGNOSIS — E7800 Pure hypercholesterolemia, unspecified: Secondary | ICD-10-CM | POA: Diagnosis not present

## 2021-03-26 DIAGNOSIS — Z0000 Encounter for general adult medical examination without abnormal findings: Secondary | ICD-10-CM | POA: Diagnosis not present

## 2021-03-26 DIAGNOSIS — K219 Gastro-esophageal reflux disease without esophagitis: Secondary | ICD-10-CM | POA: Diagnosis not present

## 2021-03-26 DIAGNOSIS — Z125 Encounter for screening for malignant neoplasm of prostate: Secondary | ICD-10-CM | POA: Diagnosis not present

## 2021-03-26 DIAGNOSIS — I10 Essential (primary) hypertension: Secondary | ICD-10-CM | POA: Diagnosis not present

## 2021-03-26 DIAGNOSIS — M109 Gout, unspecified: Secondary | ICD-10-CM | POA: Diagnosis not present

## 2021-04-08 NOTE — Progress Notes (Signed)
Cardiology Office Note   Date:  04/10/2021   ID:  Terril, [place] Feb 07, 1946, MRN [id number]  PCP:  Antony Contras, MD    Chief Complaint  Patient presents with   Follow-up   CAD  Wt Readings from Last 3 Encounters:  04/10/21 219 lb (99.3 kg)  04/13/20 220 lb 12.8 oz (100.2 kg)  04/13/19 228 lb 6.4 oz (103.6 kg)       History of Present Illness: SOCRATES CAHOON is a 76 y.o. male  with known CAD. He had multiple everolimus drug eluting stents to his right coronary artery in 2012- postdilated to 3.5-4 mm to treat a chronic total occlusion of the RCA.  He did well after this procedure.  There was some difficulty completing the procedure from the right radial approach.  He was brought back and additional stents were placed from the femoral approach.    He had an episode of chest discomfort and went to the hospital in 11/14. He ruled out for MI. He had an outpatient stress test which was negative for ischemia. He was started on Imdur then. Since that time, he has done well.    He was found to be resistant to Plavix in the past.    He had some renal insufficiency and a kidney stone, and was found to have renal artery stenosis.  A renal stent was placed in Jan 2019.    Held [place] for circumcision in 2019. No issues at that time.    He lives at [employer] on the weekends, but this has reduced with the pandemic.   He moved to [place] in 2020.  Still works from home.  "Gave him the name of Dr. Laurine Blazer in [place], since he has moved there.  He does not mind coming to [facility] but I think it is wise that he has someone closer to home."   Denies : Chest pain. Dizziness. Leg edema. Nitroglycerin use. Orthopnea. Palpitations. Paroxysmal nocturnal dyspnea. Shortness of breath. Syncope.    He goes to the gym 4 days/week.    Past Medical History:  Diagnosis Date   Aortic atherosclerosis ([place])    Bladder cancer ([place]) 2011   Coronary atherosclerosis of native coronary  artery    Diverticulosis    History of hiatal hernia    Small   History of kidney stones    61mm nonobstructive calculus in the interpolar, left kidney   Hypertension    Left hydrocele    Left renal artery stenosis ([place])    Mixed hyperlipidemia    Phimosis    Renal insufficiency     Past Surgical History:  Procedure Laterality Date   APPENDECTOMY     CIRCUMCISION N/A 03/13/2018   Procedure: CIRCUMCISION ADULT;  Surgeon: Ceasar Mons, MD;  Location: [facility];  Service: Urology;  Laterality: N/A;   heart stent  x 7   RENAL ANGIOGRAPHY N/A 03/04/2017   Procedure: RENAL ANGIOGRAPHY;  Surgeon: Serafina Mitchell, MD;  Location: [facility];  Service: Cardiovascular;  Laterality: N/A;   RENAL ARTERY STENT  03/04/2017   TONSILLECTOMY     TRANSURETHRAL RESECTION OF BLADDER TUMOR       Current Outpatient Medications  Medication Sig Dispense Refill   aspirin EC 81 MG tablet Take 81 mg by mouth daily.     atorvastatin (LIPITOR) 80 MG tablet TAKE 1 TABLET BY MOUTH EVERY DAY 90 tablet 3   BRILINTA 90 MG TABS tablet TAKE  1 TABLET BY MOUTH TWICE A DAY 180 tablet 3   fenofibrate 54 MG tablet TAKE 1 TABLET BY MOUTH DAILY 90 tablet 3   finasteride (PROSCAR) 5 MG tablet Take 5 mg by mouth daily.     folic acid (FOLVITE) 616 MCG tablet Take 800 mcg by mouth daily.      lisinopril (ZESTRIL) 10 MG tablet TAKE 1 TABLET BY MOUTH EVERY DAY 90 tablet 3   niacin 500 MG tablet Take 500 mg by mouth at bedtime.     nitroGLYCERIN (NITROSTAT) 0.4 MG SL tablet Place 1 tablet (0.4 mg total) under the tongue every 5 (five) minutes as needed for chest pain. 25 tablet 3   pantoprazole (PROTONIX) 40 MG tablet TAKE 1 TABLET BY MOUTH EVERY DAY 90 tablet 3   No current facility-administered medications for this visit.    Allergies:   Penicillins    Social History:  The patient  reports that he has never smoked. He has never used smokeless tobacco. He reports current alcohol  use. He reports that he does not use drugs.   Family History:  The patient's family history includes CAD in his brother, father, and mother; Heart attack in his father and mother.    ROS:  Please see the history of present illness.   Otherwise, review of systems are positive for prior espohagus issues off of PPI.   All other systems are reviewed and negative.    PHYSICAL EXAM: VS:  BP 106/64    Pulse (!) 58    Ht 5\' 10"  (1.778 m)    Wt 219 lb (99.3 kg)    SpO2 99%    BMI 31.42 kg/m  , BMI Body mass index is 31.42 kg/m. GEN: Well nourished, well developed, in no acute distress HEENT: normal Neck: no JVD, carotid bruits, or masses Cardiac: RRR; no murmurs, rubs, or gallops,no edema  Respiratory:  clear to auscultation bilaterally, normal work of breathing GI: soft, nontender, nondistended, + BS MS: no deformity or atrophy Skin: warm and dry, no rash Neuro:  Strength and sensation are intact Psych: euthymic mood, full affect   EKG:   The ekg ordered today demonstrates NSR, no ST segment changes   Recent Labs: No results found for requested labs within last 8760 hours.   Lipid Panel    Component Value Date/Time   CHOL 178 07/22/2019 1053   CHOL 182 02/09/2014 0803   TRIG 156 (H) 07/22/2019 1053   TRIG 177 (H) 02/09/2014 0803   HDL 56 07/22/2019 1053   HDL 65 02/09/2014 0803   CHOLHDL 3.2 07/22/2019 1053   CHOLHDL 2.3 04/03/2015 0742   VLDL 26 04/03/2015 0742   LDLCALC 95 07/22/2019 1053   LDLCALC 82 02/09/2014 0803     Other studies Reviewed: Additional studies/ records that were reviewed today with results demonstrating: .   ASSESSMENT AND PLAN:  CAD: Status post PCI of the RCA several years ago.  We again discussed having him have a cardiologist closer to home given that he now lives in [place].  Plavix resistant so using Brilinta.  Hyperlipidemia: Continue high-dose lipid-lowering therapy. Hypertensive heart disease: Avoid salty foods and processed  foods.  Regular exercise will be beneficial. Obesity: Whole food, plant-based diet recommended.  Regular exercise as well. CKD: We have discussed that PPIs can cause CRI.  Would see if decreased frequency of the medicine would still be just as effective to minimize the risk of kidney disease.  He does not have  much reflux, but has had difficulty swallowing due to lower esophagus closing.    Current medicines are reviewed at length with the patient today.  The patient concerns regarding his medicines were addressed.  The following changes have been made:  No change  Labs/ tests ordered today include:  No orders of the defined types were placed in this encounter.   Recommend 150 minutes/week of aerobic exercise Low fat, low carb, high fiber diet recommended  Disposition:   FU in 1 year   Signed, Larae Grooms, MD  04/10/2021 2:53 PM    [facility] [address], [place], [place]  [address] Phone: [phone]; Fax: [phone]

## 2021-04-10 ENCOUNTER — Ambulatory Visit: Payer: Medicare Other | Admitting: Interventional Cardiology

## 2021-04-10 ENCOUNTER — Other Ambulatory Visit: Payer: Self-pay

## 2021-04-10 ENCOUNTER — Encounter: Payer: Self-pay | Admitting: Interventional Cardiology

## 2021-04-10 VITALS — BP 106/64 | HR 58 | Ht 70.0 in | Wt 219.0 lb

## 2021-04-10 DIAGNOSIS — I722 Aneurysm of renal artery: Secondary | ICD-10-CM | POA: Diagnosis not present

## 2021-04-10 DIAGNOSIS — E7800 Pure hypercholesterolemia, unspecified: Secondary | ICD-10-CM

## 2021-04-10 DIAGNOSIS — I119 Hypertensive heart disease without heart failure: Secondary | ICD-10-CM

## 2021-04-10 DIAGNOSIS — I2510 Atherosclerotic heart disease of native coronary artery without angina pectoris: Secondary | ICD-10-CM | POA: Diagnosis not present

## 2021-04-10 DIAGNOSIS — I701 Atherosclerosis of renal artery: Secondary | ICD-10-CM | POA: Diagnosis not present

## 2021-04-10 NOTE — Patient Instructions (Signed)

## 2021-04-28 ENCOUNTER — Other Ambulatory Visit: Payer: Self-pay | Admitting: Interventional Cardiology

## 2021-05-19 ENCOUNTER — Other Ambulatory Visit: Payer: Self-pay | Admitting: Interventional Cardiology

## 2021-08-20 ENCOUNTER — Other Ambulatory Visit: Payer: Self-pay

## 2021-08-20 NOTE — Telephone Encounter (Signed)
[facility] pharmacy is requesting a refill on Pantoprazole. Would Dr. Irish Lack like to refill this medication? Please address

## 2021-08-23 ENCOUNTER — Other Ambulatory Visit: Payer: Self-pay

## 2021-08-23 MED ORDER — PANTOPRAZOLE SODIUM 40 MG PO TBEC: DELAYED_RELEASE_TABLET | ORAL | 2 refills | Status: AC

## 2021-08-29 DIAGNOSIS — L720 Epidermal cyst: Secondary | ICD-10-CM | POA: Diagnosis not present

## 2021-09-14 DIAGNOSIS — L720 Epidermal cyst: Secondary | ICD-10-CM | POA: Diagnosis not present

## 2022-04-27 ENCOUNTER — Other Ambulatory Visit: Payer: Self-pay | Admitting: Interventional Cardiology

## 2022-04-29 ENCOUNTER — Other Ambulatory Visit: Payer: Self-pay | Admitting: Interventional Cardiology

## 2022-05-27 ENCOUNTER — Other Ambulatory Visit: Payer: Self-pay | Admitting: Interventional Cardiology

## 2022-07-19 DIAGNOSIS — I2510 Atherosclerotic heart disease of native coronary artery without angina pectoris: Secondary | ICD-10-CM | POA: Diagnosis not present

## 2022-09-24 DIAGNOSIS — H04123 Dry eye syndrome of bilateral lacrimal glands: Secondary | ICD-10-CM | POA: Diagnosis not present

## 2022-09-24 DIAGNOSIS — H401112 Primary open-angle glaucoma, right eye, moderate stage: Secondary | ICD-10-CM | POA: Diagnosis not present

## 2022-09-24 DIAGNOSIS — H40022 Open angle with borderline findings, high risk, left eye: Secondary | ICD-10-CM | POA: Diagnosis not present

## 2022-09-24 DIAGNOSIS — H25813 Combined forms of age-related cataract, bilateral: Secondary | ICD-10-CM | POA: Diagnosis not present

## 2022-11-04 DIAGNOSIS — H401112 Primary open-angle glaucoma, right eye, moderate stage: Secondary | ICD-10-CM | POA: Diagnosis not present

## 2022-12-10 DIAGNOSIS — H40022 Open angle with borderline findings, high risk, left eye: Secondary | ICD-10-CM | POA: Diagnosis not present

## 2022-12-10 DIAGNOSIS — H401112 Primary open-angle glaucoma, right eye, moderate stage: Secondary | ICD-10-CM | POA: Diagnosis not present

## 2022-12-10 DIAGNOSIS — H04123 Dry eye syndrome of bilateral lacrimal glands: Secondary | ICD-10-CM | POA: Diagnosis not present

## 2022-12-10 DIAGNOSIS — H25813 Combined forms of age-related cataract, bilateral: Secondary | ICD-10-CM | POA: Diagnosis not present

## 2023-01-27 DIAGNOSIS — H401112 Primary open-angle glaucoma, right eye, moderate stage: Secondary | ICD-10-CM | POA: Diagnosis not present

## 2023-01-27 DIAGNOSIS — H04123 Dry eye syndrome of bilateral lacrimal glands: Secondary | ICD-10-CM | POA: Diagnosis not present

## 2023-01-27 DIAGNOSIS — H25813 Combined forms of age-related cataract, bilateral: Secondary | ICD-10-CM | POA: Diagnosis not present

## 2023-01-27 DIAGNOSIS — H40022 Open angle with borderline findings, high risk, left eye: Secondary | ICD-10-CM | POA: Diagnosis not present

## 2023-03-10 DIAGNOSIS — H25813 Combined forms of age-related cataract, bilateral: Secondary | ICD-10-CM | POA: Diagnosis not present

## 2023-03-10 DIAGNOSIS — H401112 Primary open-angle glaucoma, right eye, moderate stage: Secondary | ICD-10-CM | POA: Diagnosis not present

## 2023-03-10 DIAGNOSIS — H40022 Open angle with borderline findings, high risk, left eye: Secondary | ICD-10-CM | POA: Diagnosis not present

## 2023-03-10 DIAGNOSIS — H04123 Dry eye syndrome of bilateral lacrimal glands: Secondary | ICD-10-CM | POA: Diagnosis not present

## 2023-05-01 DIAGNOSIS — R8289 Other abnormal findings on cytological and histological examination of urine: Secondary | ICD-10-CM | POA: Diagnosis not present

## 2023-05-01 DIAGNOSIS — Z8551 Personal history of malignant neoplasm of bladder: Secondary | ICD-10-CM | POA: Diagnosis not present

## 2023-05-19 DIAGNOSIS — I2510 Atherosclerotic heart disease of native coronary artery without angina pectoris: Secondary | ICD-10-CM | POA: Diagnosis not present

## 2023-06-05 DIAGNOSIS — I701 Atherosclerosis of renal artery: Secondary | ICD-10-CM | POA: Diagnosis not present

## 2023-06-05 DIAGNOSIS — E7800 Pure hypercholesterolemia, unspecified: Secondary | ICD-10-CM | POA: Diagnosis not present

## 2023-06-05 DIAGNOSIS — I10 Essential (primary) hypertension: Secondary | ICD-10-CM | POA: Diagnosis not present

## 2023-06-05 DIAGNOSIS — Z0000 Encounter for general adult medical examination without abnormal findings: Secondary | ICD-10-CM | POA: Diagnosis not present

## 2023-06-05 DIAGNOSIS — K219 Gastro-esophageal reflux disease without esophagitis: Secondary | ICD-10-CM | POA: Diagnosis not present

## 2023-06-05 DIAGNOSIS — M109 Gout, unspecified: Secondary | ICD-10-CM | POA: Diagnosis not present

## 2023-06-05 DIAGNOSIS — N1831 Chronic kidney disease, stage 3a: Secondary | ICD-10-CM | POA: Diagnosis not present

## 2023-06-05 DIAGNOSIS — E038 Other specified hypothyroidism: Secondary | ICD-10-CM | POA: Diagnosis not present

## 2023-06-05 DIAGNOSIS — Z8551 Personal history of malignant neoplasm of bladder: Secondary | ICD-10-CM | POA: Diagnosis not present

## 2023-06-05 DIAGNOSIS — I2510 Atherosclerotic heart disease of native coronary artery without angina pectoris: Secondary | ICD-10-CM | POA: Diagnosis not present

## 2023-06-09 DIAGNOSIS — H25813 Combined forms of age-related cataract, bilateral: Secondary | ICD-10-CM | POA: Diagnosis not present

## 2023-06-09 DIAGNOSIS — H40022 Open angle with borderline findings, high risk, left eye: Secondary | ICD-10-CM | POA: Diagnosis not present

## 2023-06-09 DIAGNOSIS — H04123 Dry eye syndrome of bilateral lacrimal glands: Secondary | ICD-10-CM | POA: Diagnosis not present

## 2023-06-09 DIAGNOSIS — H401112 Primary open-angle glaucoma, right eye, moderate stage: Secondary | ICD-10-CM | POA: Diagnosis not present

## 2023-06-26 ENCOUNTER — Other Ambulatory Visit: Payer: Self-pay | Admitting: Interventional Cardiology

## 2023-07-01 ENCOUNTER — Other Ambulatory Visit: Payer: Self-pay | Admitting: Interventional Cardiology

## 2023-10-15 DIAGNOSIS — H25813 Combined forms of age-related cataract, bilateral: Secondary | ICD-10-CM | POA: Diagnosis not present

## 2023-10-15 DIAGNOSIS — H40022 Open angle with borderline findings, high risk, left eye: Secondary | ICD-10-CM | POA: Diagnosis not present

## 2023-10-15 DIAGNOSIS — H04123 Dry eye syndrome of bilateral lacrimal glands: Secondary | ICD-10-CM | POA: Diagnosis not present

## 2023-10-15 DIAGNOSIS — H401112 Primary open-angle glaucoma, right eye, moderate stage: Secondary | ICD-10-CM | POA: Diagnosis not present

## 2023-10-17 DIAGNOSIS — M7989 Other specified soft tissue disorders: Secondary | ICD-10-CM | POA: Diagnosis not present

## 2023-10-17 DIAGNOSIS — M25572 Pain in left ankle and joints of left foot: Secondary | ICD-10-CM | POA: Diagnosis not present

## 2023-11-13 ENCOUNTER — Other Ambulatory Visit: Payer: Self-pay | Admitting: Interventional Cardiology
